# Patient Record
Sex: Male | Born: 1960
Health system: Southern US, Community
[De-identification: ages and names within clinical notes are randomized; demographics above are authoritative.]

## PROBLEM LIST (undated history)

## (undated) DIAGNOSIS — I10 Essential (primary) hypertension: Secondary | ICD-10-CM

---

## 2012-02-27 ENCOUNTER — Other Ambulatory Visit: Payer: Self-pay | Admitting: Family Medicine

## 2012-02-27 DIAGNOSIS — R51 Headache: Secondary | ICD-10-CM

## 2012-02-28 ENCOUNTER — Inpatient Hospital Stay
Admission: RE | Admit: 2012-02-28 | Discharge: 2012-02-28 | Payer: Self-pay | Source: Ambulatory Visit | Attending: Family Medicine | Admitting: Family Medicine

## 2012-03-04 ENCOUNTER — Ambulatory Visit
Admission: RE | Admit: 2012-03-04 | Discharge: 2012-03-04 | Disposition: A | Discharge status: Home / Self Care | Payer: BC Managed Care – PPO | Source: Ambulatory Visit | Attending: Family Medicine | Admitting: Family Medicine

## 2012-03-04 ENCOUNTER — Other Ambulatory Visit: Payer: Self-pay

## 2012-03-04 DIAGNOSIS — R51 Headache: Secondary | ICD-10-CM

## 2014-07-26 ENCOUNTER — Emergency Department (HOSPITAL_COMMUNITY)
Admission: EM | Admit: 2014-07-26 | Discharge: 2014-07-26 | Disposition: A | Discharge status: Home / Self Care | Payer: BC Managed Care – PPO | Source: Home / Self Care | Attending: Family Medicine | Admitting: Family Medicine

## 2014-07-26 ENCOUNTER — Encounter (HOSPITAL_COMMUNITY): Payer: Self-pay | Admitting: Emergency Medicine

## 2014-07-26 DIAGNOSIS — K649 Unspecified hemorrhoids: Secondary | ICD-10-CM

## 2014-07-26 LAB — OCCULT BLOOD, POC DEVICE: Fecal Occult Bld: NEGATIVE

## 2014-07-26 MED ORDER — DOCUSATE SODIUM 100 MG PO CAPS: 100.0000 mg | ORAL_CAPSULE | Freq: Two times a day (BID) | ORAL | Status: AC

## 2014-07-26 NOTE — ED Provider Notes (Signed)
CSN: [id number]     Arrival date & time 07/26/14  1229 History   First MD Initiated Contact with Patient 07/26/14 1358     Chief Complaint  Patient presents with  . Blood In Stools   (Consider location/radiation/quality/duration/timing/severity/associated sxs/prior Treatment) HPI Comments: Patient presents with concerns of occasionally seeing blood in water of toilet and on toilet tissue following a few recent hard stools. States he has a normal BM this morning with no associated bleeding.  PCP: none No aspirin, NSAIDs or prescription anticoagulants. Denies abdominal pain, dizziness, dyspnea.  Does not smoke Works at [employer].   The history is provided by the patient.    History reviewed. No pertinent past medical history. History reviewed. No pertinent past surgical history. History reviewed. No pertinent family history. History  Substance Use Topics  . Smoking status: Never Smoker   . Smokeless tobacco: Not on file  . Alcohol Use: Yes     Comment: occasional    Review of Systems  All other systems reviewed and are negative.   Allergies  Review of patient's allergies indicates not on file.  Home Medications   Prior to Admission medications   Medication Sig Start Date End Date Taking? Authorizing Provider  docusate sodium (COLACE) 100 MG capsule Take 1 capsule (100 mg total) by mouth every 12 (twelve) hours. 07/26/14   Annett Gula H Shauntelle Jamerson, PA   BP 125/77 mmHg  Pulse 65  Temp(Src) 98.2 F (36.8 C) (Oral)  Resp 16  SpO2 100% Physical Exam  Constitutional: He is oriented to person, place, and time. He appears well-developed and well-nourished. No distress.  HENT:  Head: Normocephalic and atraumatic.  Eyes: Conjunctivae are normal. No scleral icterus.  Cardiovascular: Normal rate, regular rhythm and normal heart sounds.   Pulmonary/Chest: Effort normal and breath sounds normal.  Abdominal: Soft. Bowel sounds are normal. He exhibits no distension.  There is no tenderness.  Genitourinary: Rectal exam shows external hemorrhoid. Rectal exam shows no internal hemorrhoid, no fissure, no mass, no tenderness and anal tone normal. Guaiac negative stool.  Stool normal color Small external, non-thrombosed, non-tender, non-bleeding hemorrhoid   Musculoskeletal: Normal range of motion.  Neurological: He is alert and oriented to person, place, and time.  Skin: Skin is warm and dry. No pallor.  Psychiatric: He has a normal mood and affect. His behavior is normal.  Nursing note and vitals reviewed.   ED Course  Procedures (including critical care time) Labs Review Labs Reviewed - No data to display  Imaging Review No results found.   MDM   1. Hemorrhoids, unspecified hemorrhoid type    Will advise increasing dietary fiber or using OTC stool softener and Tucks pads as needed.     Lutricia Feil, [place] 07/26/14 [phone]

## 2014-07-26 NOTE — ED Notes (Signed)
Pt states that there has been blood in stool since 07/24/2014

## 2018-10-10 ENCOUNTER — Emergency Department (HOSPITAL_COMMUNITY)
Admission: EM | Admit: 2018-10-10 | Discharge: 2018-10-10 | Disposition: A | Discharge status: Home / Self Care | Payer: BLUE CROSS/BLUE SHIELD | Attending: Emergency Medicine | Admitting: Emergency Medicine

## 2018-10-10 ENCOUNTER — Emergency Department (HOSPITAL_COMMUNITY): Payer: BLUE CROSS/BLUE SHIELD

## 2018-10-10 ENCOUNTER — Other Ambulatory Visit: Payer: Self-pay

## 2018-10-10 DIAGNOSIS — Z79899 Other long term (current) drug therapy: Secondary | ICD-10-CM | POA: Diagnosis not present

## 2018-10-10 DIAGNOSIS — R079 Chest pain, unspecified: Secondary | ICD-10-CM | POA: Insufficient documentation

## 2018-10-10 DIAGNOSIS — I10 Essential (primary) hypertension: Secondary | ICD-10-CM

## 2018-10-10 LAB — BASIC METABOLIC PANEL
Anion gap: 8 (ref 5–15)
BUN: 10 mg/dL (ref 6–20)
CO2: 28 mmol/L (ref 22–32)
Calcium: 9.5 mg/dL (ref 8.9–10.3)
Chloride: 105 mmol/L (ref 98–111)
Creatinine, Ser: 1.3 mg/dL — ABNORMAL HIGH (ref 0.61–1.24)
GFR, EST NON AFRICAN AMERICAN: 60 mL/min — AB (ref >60)
Glucose, Bld: 101 mg/dL — ABNORMAL HIGH (ref 70–99)
Potassium: 3.7 mmol/L (ref 3.5–5.1)
SODIUM: 141 mmol/L (ref 135–145)

## 2018-10-10 LAB — CBC
HCT: 46.3 % (ref 39.0–52.0)
Hemoglobin: 15 g/dL (ref 13.0–17.0)
MCH: 27.3 pg (ref 26.0–34.0)
MCHC: 32.4 g/dL (ref 30.0–36.0)
MCV: 84.2 fL (ref 80.0–100.0)
NRBC: 0 % (ref 0.0–0.2)
Platelets: 168 10*3/uL (ref 150–400)
RBC: 5.5 MIL/uL (ref 4.22–5.81)
RDW: 12.4 % (ref 11.5–15.5)
WBC: 5.2 10*3/uL (ref 4.0–10.5)

## 2018-10-10 LAB — I-STAT TROPONIN, ED: Troponin i, poc: 0 ng/mL (ref 0.00–0.08)

## 2018-10-10 MED ORDER — SODIUM CHLORIDE 0.9% FLUSH
3.0000 mL | Freq: Once | INTRAVENOUS | Status: AC
  Administered 2018-10-10: 3 mL via INTRAVENOUS

## 2018-10-10 MED ORDER — KETOROLAC TROMETHAMINE 30 MG/ML IJ SOLN
30.0000 mg | Freq: Once | INTRAMUSCULAR | Status: AC
  Administered 2018-10-10: 30 mg via INTRAVENOUS
  Filled 2018-10-10: qty 1

## 2018-10-10 NOTE — Discharge Instructions (Addendum)
You were seen in the emergency department for chest and back pain that is been going on for 2 months.  You had blood work, EKG, and a chest x-ray that did not show any serious findings.  I think this pain is most likely related to your chest and back muscles from your work duties.  You should use ibuprofen 200 mg 3 times a day with some food.  You should also do some gentle stretching.  Please follow-up with the medical clinic as your blood pressure was elevated and will need to be followed and potentially put on some medication for that.  Return if any concerns.

## 2018-10-10 NOTE — ED Provider Notes (Signed)
[facility] EMERGENCY DEPARTMENT Provider Note   CSN: [id number] Arrival date & time: 10/10/18  1502     History   Chief Complaint Chief Complaint  Patient presents with  . Chest Pain    HPI Howard Faulkner is a 58 y.o. male.  He is presenting to the emergency department with complaint of 2 months of chest pain.  It says that usually occurs Monday through Friday while he is working.  He says Friday is the worst.  It is across to his chest and through his mid back.  It hurts more with movement.  Improves with rest over the weekend.  He is tried nothing for it.  Not associate with any nausea or vomiting or shortness of breath.  He is never had any heart troubles before.  He is from [place] and is been here for 5 years and primarily speaks [place].  I asked him if he preferred to use the interpreter and he said no. The history is provided by the patient.  Chest Pain  Pain location:  Substernal area, L lateral chest and R lateral chest Pain quality: aching   Pain radiates to:  Mid back Pain severity:  Moderate Onset quality:  Gradual Duration:  2 months Timing:  Intermittent Progression:  Unchanged Chronicity:  New Context: lifting and movement   Context: not trauma   Relieved by:  Rest Worsened by:  Exertion and movement Ineffective treatments:  None tried Associated symptoms: back pain   Associated symptoms: no abdominal pain, no altered mental status, no fever, no heartburn, no numbness, no shortness of breath and no syncope   Risk factors: no hypertension, no smoking and no surgery     No past medical history on file.  There are no active problems to display for this patient.   No past surgical history on file.      Home Medications    Prior to Admission medications   Medication Sig Start Date End Date Taking? Authorizing Provider  docusate sodium (COLACE) 100 MG capsule Take 1 capsule (100 mg total) by mouth every 12 (twelve) hours. 07/26/14    Presson, Audelia Hives, PA    Family History No family history on file.  Social History Social History   Tobacco Use  . Smoking status: Never Smoker  Substance Use Topics  . Alcohol use: Yes    Comment: occasional  . Drug use: No     Allergies   Patient has no allergy information on record.   Review of Systems Review of Systems  Constitutional: Negative for fever.  HENT: Negative for sore throat.   Eyes: Negative for visual disturbance.  Respiratory: Negative for shortness of breath.   Cardiovascular: Positive for chest pain. Negative for syncope.  Gastrointestinal: Negative for abdominal pain and heartburn.  Genitourinary: Negative for dysuria.  Musculoskeletal: Positive for back pain and neck pain.  Skin: Negative for rash.  Neurological: Negative for numbness.     Physical Exam Updated Vital Signs BP (!) 150/106   Pulse 75   Resp 18   SpO2 98%   Physical Exam Vitals signs and nursing note reviewed.  Constitutional:      Appearance: He is well-developed.  HENT:     Head: Normocephalic and atraumatic.  Eyes:     Conjunctiva/sclera: Conjunctivae normal.  Neck:     Musculoskeletal: Neck supple.  Cardiovascular:     Rate and Rhythm: Normal rate and regular rhythm.     Heart sounds: No murmur.  Pulmonary:     Effort: Pulmonary effort is normal. No respiratory distress.     Breath sounds: Normal breath sounds.  Chest:     Chest wall: Tenderness (over sternum) present.  Abdominal:     Palpations: Abdomen is soft.     Tenderness: There is no abdominal tenderness.  Musculoskeletal:     Right lower leg: He exhibits no tenderness. No edema.     Left lower leg: He exhibits no tenderness. No edema.  Skin:    General: Skin is warm and dry.     Capillary Refill: Capillary refill takes less than 2 seconds.  Neurological:     General: No focal deficit present.     Mental Status: He is alert and oriented to person, place, and time.     Motor: No weakness.       ED Treatments / Results  Labs (all labs ordered are listed, but only abnormal results are displayed) Labs Reviewed  BASIC METABOLIC PANEL - Abnormal; Notable for the following components:      Result Value   Glucose, Bld 101 (*)    Creatinine, Ser 1.30 (*)    GFR calc non Af Amer 60 (*)    All other components within normal limits  CBC  I-STAT TROPONIN, ED    EKG EKG Interpretation  Date/Time:  Friday October 10 2018 15:05:01 EST Ventricular Rate:  84 PR Interval:    QRS Duration: 83 QT Interval:  351 QTC Calculation: 415 R Axis:   13 Text Interpretation:  Sinus rhythm Borderline T abnormalities, inferior leads no prior to compare with Confirmed by Aletta Edouard [id number]) on 10/10/2018 3:14:37 PM   Radiology Dg Chest 2 View  Result Date: 10/10/2018 CLINICAL DATA:  Left chest pain and shortness of breath for 2 months. EXAM: CHEST - 2 VIEW COMPARISON:  None. FINDINGS: The cardiac silhouette, mediastinal and hilar contours are within normal limits. The lungs are clear. No pleural effusion. No worrisome pulmonary lesions. The bony thorax is intact. IMPRESSION: No acute cardiopulmonary findings. Electronically Signed   By: Marijo Sanes M.D.   On: 10/10/2018 16:04    Procedures Procedures (including critical care time)  Medications Ordered in ED Medications  sodium chloride flush (NS) 0.9 % injection 3 mL (3 mLs Intravenous Given 10/10/18 1527)  ketorolac (TORADOL) 30 MG/ML injection 30 mg (30 mg Intravenous Given 10/10/18 1648)     Initial Impression / Assessment and Plan / ED Course  I have reviewed the triage vital signs and the nursing notes.  Pertinent labs & imaging results that were available during my care of the patient were reviewed by me and considered in my medical decision making (see chart for details).  Clinical Course as of Oct 10 2250  Fri Oct 10, 2018  1531 With no past medical history here with 2 months of intermittent chest pain.  He says he  usually gets the pain Monday through Friday and it is associated with his work.  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  He has some reproducible pain on palpation of his chest.  EKG is unremarkable the no priors to compare with.  He is getting some screening labs including chest x-ray troponin.   [MB]  2482 I reviewed the results of the patient testing with him via a [place] interpreter.  He understood and he actually has a primary care doctor to follow-up with.  I answered all his questions to the best my ability.   [  MB]    Clinical Course User Index [MB] Hayden Rasmussen, MD     Final Clinical Impressions(s) / ED Diagnoses   Final diagnoses:  Nonspecific chest pain  Hypertension, unspecified type    ED Discharge Orders    None       Hayden Rasmussen, MD 10/10/18 2252

## 2018-10-10 NOTE — ED Triage Notes (Signed)
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

## 2019-02-25 ENCOUNTER — Encounter: Payer: Self-pay | Admitting: Internal Medicine

## 2019-10-01 DIAGNOSIS — H401133 Primary open-angle glaucoma, bilateral, severe stage: Secondary | ICD-10-CM | POA: Diagnosis not present

## 2019-10-23 DIAGNOSIS — E782 Mixed hyperlipidemia: Secondary | ICD-10-CM | POA: Diagnosis not present

## 2019-10-23 DIAGNOSIS — R1084 Generalized abdominal pain: Secondary | ICD-10-CM | POA: Diagnosis not present

## 2019-10-23 DIAGNOSIS — N529 Male erectile dysfunction, unspecified: Secondary | ICD-10-CM | POA: Diagnosis not present

## 2019-10-23 DIAGNOSIS — I10 Essential (primary) hypertension: Secondary | ICD-10-CM | POA: Diagnosis not present

## 2019-10-23 DIAGNOSIS — R972 Elevated prostate specific antigen [PSA]: Secondary | ICD-10-CM | POA: Diagnosis not present

## 2019-10-23 DIAGNOSIS — E559 Vitamin D deficiency, unspecified: Secondary | ICD-10-CM | POA: Diagnosis not present

## 2019-10-23 DIAGNOSIS — N401 Enlarged prostate with lower urinary tract symptoms: Secondary | ICD-10-CM | POA: Diagnosis not present

## 2019-10-27 DIAGNOSIS — R972 Elevated prostate specific antigen [PSA]: Secondary | ICD-10-CM | POA: Diagnosis not present

## 2019-10-27 DIAGNOSIS — Z0001 Encounter for general adult medical examination with abnormal findings: Secondary | ICD-10-CM | POA: Diagnosis not present

## 2019-10-27 DIAGNOSIS — R739 Hyperglycemia, unspecified: Secondary | ICD-10-CM | POA: Diagnosis not present

## 2019-10-27 DIAGNOSIS — K858 Other acute pancreatitis without necrosis or infection: Secondary | ICD-10-CM | POA: Diagnosis not present

## 2019-10-27 DIAGNOSIS — Z01118 Encounter for examination of ears and hearing with other abnormal findings: Secondary | ICD-10-CM | POA: Diagnosis not present

## 2019-10-27 DIAGNOSIS — E782 Mixed hyperlipidemia: Secondary | ICD-10-CM | POA: Diagnosis not present

## 2019-10-27 DIAGNOSIS — Z136 Encounter for screening for cardiovascular disorders: Secondary | ICD-10-CM | POA: Diagnosis not present

## 2019-10-27 DIAGNOSIS — I10 Essential (primary) hypertension: Secondary | ICD-10-CM | POA: Diagnosis not present

## 2019-11-10 DIAGNOSIS — E782 Mixed hyperlipidemia: Secondary | ICD-10-CM | POA: Diagnosis not present

## 2019-11-10 DIAGNOSIS — K858 Other acute pancreatitis without necrosis or infection: Secondary | ICD-10-CM | POA: Diagnosis not present

## 2019-11-10 DIAGNOSIS — R739 Hyperglycemia, unspecified: Secondary | ICD-10-CM | POA: Diagnosis not present

## 2019-11-10 DIAGNOSIS — Z0001 Encounter for general adult medical examination with abnormal findings: Secondary | ICD-10-CM | POA: Diagnosis not present

## 2019-11-10 DIAGNOSIS — F524 Premature ejaculation: Secondary | ICD-10-CM | POA: Diagnosis not present

## 2019-11-10 DIAGNOSIS — I10 Essential (primary) hypertension: Secondary | ICD-10-CM | POA: Diagnosis not present

## 2019-11-10 DIAGNOSIS — R972 Elevated prostate specific antigen [PSA]: Secondary | ICD-10-CM | POA: Diagnosis not present

## 2019-12-03 DIAGNOSIS — C61 Malignant neoplasm of prostate: Secondary | ICD-10-CM | POA: Diagnosis not present

## 2019-12-03 DIAGNOSIS — R3915 Urgency of urination: Secondary | ICD-10-CM | POA: Diagnosis not present

## 2020-02-08 DIAGNOSIS — C61 Malignant neoplasm of prostate: Secondary | ICD-10-CM | POA: Diagnosis not present

## 2020-02-15 DIAGNOSIS — R3915 Urgency of urination: Secondary | ICD-10-CM | POA: Diagnosis not present

## 2020-02-15 DIAGNOSIS — C61 Malignant neoplasm of prostate: Secondary | ICD-10-CM | POA: Diagnosis not present

## 2020-02-18 DIAGNOSIS — Z03818 Encounter for observation for suspected exposure to other biological agents ruled out: Secondary | ICD-10-CM | POA: Diagnosis not present

## 2020-02-18 DIAGNOSIS — Z20822 Contact with and (suspected) exposure to covid-19: Secondary | ICD-10-CM | POA: Diagnosis not present

## 2020-02-18 DIAGNOSIS — Z0131 Encounter for examination of blood pressure with abnormal findings: Secondary | ICD-10-CM | POA: Diagnosis not present

## 2020-06-10 DIAGNOSIS — H401133 Primary open-angle glaucoma, bilateral, severe stage: Secondary | ICD-10-CM | POA: Diagnosis not present

## 2020-06-24 ENCOUNTER — Other Ambulatory Visit: Payer: Self-pay | Admitting: Urology

## 2020-06-24 DIAGNOSIS — C61 Malignant neoplasm of prostate: Secondary | ICD-10-CM

## 2020-08-05 DIAGNOSIS — H401133 Primary open-angle glaucoma, bilateral, severe stage: Secondary | ICD-10-CM | POA: Diagnosis not present

## 2020-08-08 DIAGNOSIS — C61 Malignant neoplasm of prostate: Secondary | ICD-10-CM | POA: Diagnosis not present

## 2020-08-15 DIAGNOSIS — C61 Malignant neoplasm of prostate: Secondary | ICD-10-CM | POA: Diagnosis not present

## 2020-08-15 DIAGNOSIS — R3915 Urgency of urination: Secondary | ICD-10-CM | POA: Diagnosis not present

## 2020-09-09 DIAGNOSIS — M79672 Pain in left foot: Secondary | ICD-10-CM | POA: Diagnosis not present

## 2020-09-09 DIAGNOSIS — M19072 Primary osteoarthritis, left ankle and foot: Secondary | ICD-10-CM | POA: Diagnosis not present

## 2020-09-09 DIAGNOSIS — M19071 Primary osteoarthritis, right ankle and foot: Secondary | ICD-10-CM | POA: Diagnosis not present

## 2020-09-27 DIAGNOSIS — E782 Mixed hyperlipidemia: Secondary | ICD-10-CM | POA: Diagnosis not present

## 2020-09-27 DIAGNOSIS — R972 Elevated prostate specific antigen [PSA]: Secondary | ICD-10-CM | POA: Diagnosis not present

## 2020-09-27 DIAGNOSIS — Z0001 Encounter for general adult medical examination with abnormal findings: Secondary | ICD-10-CM | POA: Diagnosis not present

## 2020-09-27 DIAGNOSIS — I10 Essential (primary) hypertension: Secondary | ICD-10-CM | POA: Diagnosis not present

## 2020-09-27 DIAGNOSIS — F524 Premature ejaculation: Secondary | ICD-10-CM | POA: Diagnosis not present

## 2020-09-27 DIAGNOSIS — E559 Vitamin D deficiency, unspecified: Secondary | ICD-10-CM | POA: Diagnosis not present

## 2020-10-07 DIAGNOSIS — M79671 Pain in right foot: Secondary | ICD-10-CM | POA: Diagnosis not present

## 2020-10-07 DIAGNOSIS — M19071 Primary osteoarthritis, right ankle and foot: Secondary | ICD-10-CM | POA: Diagnosis not present

## 2020-10-19 ENCOUNTER — Other Ambulatory Visit: Payer: Self-pay | Admitting: Orthopaedic Surgery

## 2020-10-19 DIAGNOSIS — M25571 Pain in right ankle and joints of right foot: Secondary | ICD-10-CM

## 2020-10-20 ENCOUNTER — Other Ambulatory Visit: Payer: Self-pay | Admitting: Orthopaedic Surgery

## 2020-10-20 DIAGNOSIS — M25572 Pain in left ankle and joints of left foot: Secondary | ICD-10-CM

## 2020-11-11 ENCOUNTER — Ambulatory Visit
Admission: RE | Admit: 2020-11-11 | Discharge: 2020-11-11 | Disposition: A | Discharge status: Home / Self Care | Payer: BC Managed Care – PPO | Source: Ambulatory Visit | Attending: Orthopaedic Surgery | Admitting: Orthopaedic Surgery

## 2020-11-11 ENCOUNTER — Other Ambulatory Visit: Payer: BLUE CROSS/BLUE SHIELD

## 2020-11-11 ENCOUNTER — Other Ambulatory Visit: Payer: Self-pay

## 2020-11-11 DIAGNOSIS — R600 Localized edema: Secondary | ICD-10-CM | POA: Diagnosis not present

## 2020-11-11 DIAGNOSIS — M25572 Pain in left ankle and joints of left foot: Secondary | ICD-10-CM | POA: Diagnosis not present

## 2020-11-15 DIAGNOSIS — I10 Essential (primary) hypertension: Secondary | ICD-10-CM | POA: Diagnosis not present

## 2020-11-15 DIAGNOSIS — E559 Vitamin D deficiency, unspecified: Secondary | ICD-10-CM | POA: Diagnosis not present

## 2020-11-15 DIAGNOSIS — E782 Mixed hyperlipidemia: Secondary | ICD-10-CM | POA: Diagnosis not present

## 2020-11-15 DIAGNOSIS — N401 Enlarged prostate with lower urinary tract symptoms: Secondary | ICD-10-CM | POA: Diagnosis not present

## 2020-11-18 DIAGNOSIS — M19071 Primary osteoarthritis, right ankle and foot: Secondary | ICD-10-CM | POA: Diagnosis not present

## 2021-01-03 DIAGNOSIS — R1084 Generalized abdominal pain: Secondary | ICD-10-CM | POA: Diagnosis not present

## 2021-01-03 DIAGNOSIS — K5901 Slow transit constipation: Secondary | ICD-10-CM | POA: Diagnosis not present

## 2021-02-01 ENCOUNTER — Other Ambulatory Visit: Payer: Self-pay | Admitting: Urology

## 2021-02-01 DIAGNOSIS — C61 Malignant neoplasm of prostate: Secondary | ICD-10-CM

## 2021-02-13 DIAGNOSIS — C61 Malignant neoplasm of prostate: Secondary | ICD-10-CM | POA: Diagnosis not present

## 2021-02-21 DIAGNOSIS — C61 Malignant neoplasm of prostate: Secondary | ICD-10-CM | POA: Diagnosis not present

## 2021-02-21 DIAGNOSIS — R3915 Urgency of urination: Secondary | ICD-10-CM | POA: Diagnosis not present

## 2021-03-09 DIAGNOSIS — I10 Essential (primary) hypertension: Secondary | ICD-10-CM | POA: Diagnosis not present

## 2021-03-09 DIAGNOSIS — G4489 Other headache syndrome: Secondary | ICD-10-CM | POA: Diagnosis not present

## 2021-03-09 DIAGNOSIS — E782 Mixed hyperlipidemia: Secondary | ICD-10-CM | POA: Diagnosis not present

## 2021-03-09 DIAGNOSIS — E559 Vitamin D deficiency, unspecified: Secondary | ICD-10-CM | POA: Diagnosis not present

## 2021-03-17 ENCOUNTER — Emergency Department (HOSPITAL_COMMUNITY): Payer: BC Managed Care – PPO

## 2021-03-17 ENCOUNTER — Emergency Department (HOSPITAL_COMMUNITY)
Admission: EM | Admit: 2021-03-17 | Discharge: 2021-03-18 | Disposition: A | Discharge status: Home / Self Care | Payer: BC Managed Care – PPO | Attending: Emergency Medicine | Admitting: Emergency Medicine

## 2021-03-17 ENCOUNTER — Other Ambulatory Visit: Payer: Self-pay

## 2021-03-17 ENCOUNTER — Encounter (HOSPITAL_COMMUNITY): Payer: Self-pay

## 2021-03-17 ENCOUNTER — Ambulatory Visit
Admission: EM | Admit: 2021-03-17 | Discharge: 2021-03-17 | Disposition: A | Discharge status: Home / Self Care | Payer: BC Managed Care – PPO

## 2021-03-17 DIAGNOSIS — R079 Chest pain, unspecified: Secondary | ICD-10-CM | POA: Insufficient documentation

## 2021-03-17 DIAGNOSIS — G43909 Migraine, unspecified, not intractable, without status migrainosus: Secondary | ICD-10-CM | POA: Insufficient documentation

## 2021-03-17 DIAGNOSIS — R519 Headache, unspecified: Secondary | ICD-10-CM

## 2021-03-17 DIAGNOSIS — Z79899 Other long term (current) drug therapy: Secondary | ICD-10-CM | POA: Insufficient documentation

## 2021-03-17 DIAGNOSIS — Z20822 Contact with and (suspected) exposure to covid-19: Secondary | ICD-10-CM | POA: Diagnosis not present

## 2021-03-17 DIAGNOSIS — R0789 Other chest pain: Secondary | ICD-10-CM | POA: Diagnosis not present

## 2021-03-17 DIAGNOSIS — I10 Essential (primary) hypertension: Secondary | ICD-10-CM | POA: Insufficient documentation

## 2021-03-17 DIAGNOSIS — M25512 Pain in left shoulder: Secondary | ICD-10-CM

## 2021-03-17 HISTORY — DX: Essential (primary) hypertension: I10

## 2021-03-17 LAB — BASIC METABOLIC PANEL
Anion gap: 6 (ref 5–15)
BUN: 9 mg/dL (ref 8–23)
CO2: 27 mmol/L (ref 22–32)
Calcium: 9.4 mg/dL (ref 8.9–10.3)
Chloride: 104 mmol/L (ref 98–111)
Creatinine, Ser: 1.14 mg/dL (ref 0.61–1.24)
GFR, Estimated: >60 mL/min (ref >60)
Glucose, Bld: 99 mg/dL (ref 70–99)
Potassium: 4.2 mmol/L (ref 3.5–5.1)
Sodium: 137 mmol/L (ref 135–145)

## 2021-03-17 LAB — CBC WITH DIFFERENTIAL/PLATELET
Abs Immature Granulocytes: 0.03 10*3/uL (ref 0.00–0.07)
Basophils Absolute: 0.1 10*3/uL (ref 0.0–0.1)
Basophils Relative: 1 %
Eosinophils Absolute: 0.1 10*3/uL (ref 0.0–0.5)
Eosinophils Relative: 2 %
HCT: 45 % (ref 39.0–52.0)
Hemoglobin: 14.8 g/dL (ref 13.0–17.0)
Immature Granulocytes: 1 %
Lymphocytes Relative: 39 %
Lymphs Abs: 2.5 10*3/uL (ref 0.7–4.0)
MCH: 28 pg (ref 26.0–34.0)
MCHC: 32.9 g/dL (ref 30.0–36.0)
MCV: 85.2 fL (ref 80.0–100.0)
Monocytes Absolute: 0.4 10*3/uL (ref 0.1–1.0)
Monocytes Relative: 7 %
Neutro Abs: 3.2 10*3/uL (ref 1.7–7.7)
Neutrophils Relative %: 50 %
Platelets: 207 10*3/uL (ref 150–400)
RBC: 5.28 MIL/uL (ref 4.22–5.81)
RDW: 12.3 % (ref 11.5–15.5)
WBC: 6.3 10*3/uL (ref 4.0–10.5)
nRBC: 0 % (ref 0.0–0.2)

## 2021-03-17 LAB — TROPONIN I (HIGH SENSITIVITY): Troponin I (High Sensitivity): <2 ng/L (ref <18)

## 2021-03-17 MED ORDER — IOHEXOL 350 MG/ML SOLN
80.0000 mL | Freq: Once | INTRAVENOUS | Status: AC | PRN
  Administered 2021-03-17: 80 mL via INTRAVENOUS

## 2021-03-17 MED ORDER — KETOROLAC TROMETHAMINE 30 MG/ML IJ SOLN
15.0000 mg | Freq: Once | INTRAMUSCULAR | Status: AC
  Administered 2021-03-17: 15 mg via INTRAVENOUS
  Filled 2021-03-17: qty 1

## 2021-03-17 MED ORDER — DIPHENHYDRAMINE HCL 50 MG/ML IJ SOLN
25.0000 mg | Freq: Once | INTRAMUSCULAR | Status: AC
  Administered 2021-03-17: 25 mg via INTRAVENOUS
  Filled 2021-03-17: qty 1

## 2021-03-17 MED ORDER — METOCLOPRAMIDE HCL 5 MG/ML IJ SOLN
10.0000 mg | Freq: Once | INTRAMUSCULAR | Status: AC
  Administered 2021-03-17: 10 mg via INTRAVENOUS
  Filled 2021-03-17: qty 2

## 2021-03-17 NOTE — ED Provider Notes (Signed)
[facility] DEPT Provider Note   CSN: [id number] Arrival date & time: 03/17/21  1920     History Chief Complaint  Patient presents with   Migraine   Blurred Vision    Howard Faulkner is a 60 y.o. male.  Patient presents to the emergency department with a chief complaint of persistent left-sided headaches for the past couple of weeks.  His daughter acts as Optometrist, Corporate investment banker was malfunctioning.  He states that the headache is located to the left side of his head.  He reports associated blurry vision, he does have history of glaucoma, but reports that his vision has worsened in the past few weeks.  He has seen his doctor for the headaches and was prescribed a pain reliever.  He denies numbness, weakness, or tingling of his extremities.  Denies fevers or chills.  He reports some muscle soreness in his upper neck and back.  The history is provided by the patient. A language interpreter was used (Daughter).      Past Medical History:  Diagnosis Date   Hypertension     There are no problems to display for this patient.   History reviewed. No pertinent surgical history.     History reviewed. No pertinent family history.  Social History   Tobacco Use   Smoking status: Never   Smokeless tobacco: Never  Substance Use Topics   Alcohol use: Yes    Comment: occasional   Drug use: No    Home Medications Prior to Admission medications   Medication Sig Start Date End Date Taking? Authorizing Provider  amLODipine-olmesartan (AZOR) 5-20 MG tablet Take 1 tablet by mouth daily. 11/15/20   [provider]  docusate sodium (COLACE) 100 MG capsule Take 1 capsule (100 mg total) by mouth every 12 (twelve) hours. 07/26/14   Presson, Audelia Hives, PA  dorzolamide-timolol (COSOPT) 22.3-6.8 MG/ML ophthalmic solution 1 drop 2 (two) times daily. 12/30/20   [provider]  etodolac (LODINE) 500 MG tablet Take 500 mg by mouth 2 (two)  times daily. 03/14/21   [provider]  gabapentin (NEURONTIN) 300 MG capsule Take 300 mg by mouth 2 (two) times daily. 03/09/21   [provider]  latanoprost (XALATAN) 0.005 % ophthalmic solution SMARTSIG:1 Drop(s) In Eye(s) Every Evening 12/30/20   [provider]  tamsulosin (FLOMAX) 0.4 MG CAPS capsule Take 0.4 mg by mouth daily. 11/15/20   [provider]    Allergies    Patient has no known allergies.  Review of Systems   Review of Systems  All other systems reviewed and are negative.  Physical Exam Updated Vital Signs BP (!) 151/92   Pulse 71   Temp 99.9 F (37.7 C) (Oral)   Resp (!) 21   SpO2 100%   Physical Exam Vitals and nursing note reviewed.  Constitutional:      Appearance: He is well-developed.  HENT:     Head: Normocephalic and atraumatic.  Eyes:     Conjunctiva/sclera: Conjunctivae normal.  Neck:     Comments: Cervical paraspinal muscle tenderness Cardiovascular:     Rate and Rhythm: Normal rate and regular rhythm.     Heart sounds: No murmur heard. Pulmonary:     Effort: Pulmonary effort is normal. No respiratory distress.     Breath sounds: Normal breath sounds.  Abdominal:     Palpations: Abdomen is soft.     Tenderness: There is no abdominal tenderness.  Musculoskeletal:     Cervical back: Neck  supple.  Lymphadenopathy:     Cervical: No cervical adenopathy.  Skin:    General: Skin is warm and dry.  Neurological:     Mental Status: He is alert.     Comments: CN 3-12 intact, speech is clear, movements are goal oriented Normal sensation and strength in extremities  Psychiatric:        Mood and Affect: Mood normal.        Behavior: Behavior normal.    ED Results / Procedures / Treatments   Labs (all labs ordered are listed, but only abnormal results are displayed) Labs Reviewed  RESP PANEL BY RT-PCR (FLU A&B, COVID) ARPGX2  CBC WITH DIFFERENTIAL/PLATELET  BASIC METABOLIC PANEL  TROPONIN I (HIGH  SENSITIVITY)  TROPONIN I (HIGH SENSITIVITY)    EKG None  Radiology DG Chest 2 View  Result Date: 03/17/2021 CLINICAL DATA:  Left-sided headache radiating to left side of neck, 1 month of thoracic pain worse with bending EXAM: CHEST - 2 VIEW COMPARISON:  10/10/2018 FINDINGS: Frontal and lateral views of the chest demonstrate an unremarkable cardiac silhouette. No airspace disease, effusion, or pneumothorax. No acute bony abnormalities. IMPRESSION: 1. No acute intrathoracic process. Electronically Signed   By: Randa Ngo M.D.   On: 03/17/2021 21:20   CT Head Wo Contrast  Result Date: 03/17/2021 CLINICAL DATA:  Acute neurologic defect, left-sided headache, blurred vision EXAM: CT HEAD WITHOUT CONTRAST TECHNIQUE: Contiguous axial images were obtained from the base of the skull through the vertex without intravenous contrast. COMPARISON:  03/04/2012 FINDINGS: Brain: Normal anatomic configuration. No abnormal intra or extra-axial mass lesion or fluid collection. No abnormal mass effect or midline shift. No evidence of acute intracranial hemorrhage or infarct. Ventricular size is normal. Cerebellum unremarkable. Vascular: Unremarkable Skull: Intact Sinuses/Orbits: Paranasal sinuses are clear. Orbits are unremarkable. Other: Mastoid air cells and middle ear cavities are clear. IMPRESSION: No acute intracranial abnormality.  Normal exam. Electronically Signed   By: Fidela Salisbury MD   On: 03/17/2021 20:34    Procedures Procedures   Medications Ordered in ED Medications  ketorolac (TORADOL) 30 MG/ML injection 15 mg (has no administration in time range)  metoCLOPramide (REGLAN) injection 10 mg (has no administration in time range)  diphenhydrAMINE (BENADRYL) injection 25 mg (has no administration in time range)    ED Course  I have reviewed the triage vital signs and the nursing notes.  Pertinent labs & imaging results that were available during my care of the patient were reviewed by me and  considered in my medical decision making (see chart for details).    MDM Rules/Calculators/A&P                          This patient complains of headache x several weeks, this involves an extensive number of treatment options, and is a complaint that carries with it a high risk of complications and morbidity.    Labs ordered in triage are negative for any significant abnormality.  Imaging Interpretation CT head ordered in triage is negative, will add CT angio head and neck.  If this is negative, feel the patient can be safely discharged with outpatient follow-up.  Medications I ordered medication toradol, reglan, benadryl for headache  Sources Additional history obtained from daughter, who corroborates history.  Reassessments After the interventions stated above, I reevaluated the patient and found feeling improved, states headache symptoms have resolved.  Patient is alert and oriented.  Given that patient has been having the  symptoms for the past several weeks, reassuring results tonight, I do not feel that further emergent work-up is indicated.  I have a low suspicion for meningitis or encephalitis given the length of illness.  I am suspicious for COVID, COVID test is pending.  I have discussed the treatment plan with the patient, he is agreeable with following up on an outpatient basis.  Return precautions discussed.  Consultants None  Plan Discharge, follow-up with PCP, neuro, and ophthalmology.   Final Clinical Impression(s) / ED Diagnoses Final diagnoses:  Chest pain    Rx / DC Orders ED Discharge Orders     None        Montine Circle, PA-C 03/18/21 0107    Sherwood Gambler, MD 03/21/21 [phone]

## 2021-03-17 NOTE — ED Triage Notes (Signed)
Pt reports headache that radiates to left eye and left side of neck that began 2-3 weeks ago. Pt has hx of glaucoma is completely blind in right eye.

## 2021-03-17 NOTE — ED Provider Notes (Signed)
[facility]   MRN: [id number] DOB: 07/11/1961  Subjective:   Howard Faulkner is a 60 y.o. male presenting for 1 month history of persistent and worsening left-sided headache, head pain, now having left shoulder pain, worsening eye pain and blurred vision.  He has a longstanding history of glaucoma and has had blurred vision in the past but feels like it is worse now.  He is also had 1 month history of chest pain across his entire chest that radiates to his back.  He discussed this with his primary care provider but his daughter states that no tests were really done.  He is not a smoker.  No history of MI, heart disease.  He does have a history of hypertension and is on medication for this.  Denies diaphoresis, nausea, vomiting, weakness on one side of the body, numbness and/or tingling.  No current facility-administered medications for this encounter.  Current Outpatient Medications:    amLODipine-olmesartan (AZOR) 5-20 MG tablet, Take 1 tablet by mouth daily., Disp: , Rfl:    docusate sodium (COLACE) 100 MG capsule, Take 1 capsule (100 mg total) by mouth every 12 (twelve) hours., Disp: 60 capsule, Rfl: 0   dorzolamide-timolol (COSOPT) 22.3-6.8 MG/ML ophthalmic solution, 1 drop 2 (two) times daily., Disp: , Rfl:    etodolac (LODINE) 500 MG tablet, Take 500 mg by mouth 2 (two) times daily., Disp: , Rfl:    gabapentin (NEURONTIN) 300 MG capsule, Take 300 mg by mouth 2 (two) times daily., Disp: , Rfl:    latanoprost (XALATAN) 0.005 % ophthalmic solution, SMARTSIG:1 Drop(s) In Eye(s) Every Evening, Disp: , Rfl:    tamsulosin (FLOMAX) 0.4 MG CAPS capsule, Take 0.4 mg by mouth daily., Disp: , Rfl:    No Known Allergies  Past Medical History:  Diagnosis Date   Hypertension      History reviewed. No pertinent surgical history.  History reviewed. No pertinent family history.  Social History   Tobacco Use   Smoking status: Never   Smokeless tobacco: Never  Substance Use Topics    Alcohol use: Yes    Comment: occasional   Drug use: No    ROS   Objective:   Vitals: BP (!) 150/95 (BP Location: Left Arm)   Pulse 83   Temp 99.1 F (37.3 C) (Oral)   Resp 18   SpO2 96%   Physical Exam Constitutional:      General: He is not in acute distress.    Appearance: Normal appearance. He is well-developed. He is not ill-appearing, toxic-appearing or diaphoretic.  HENT:     Head: Normocephalic and atraumatic.     Right Ear: External ear normal.     Left Ear: External ear normal.     Nose: Nose normal.     Mouth/Throat:     Mouth: Mucous membranes are moist.     Pharynx: Oropharynx is clear.  Eyes:     General: Visual field deficit (not new) present. No scleral icterus.    Pupils: Pupils are unequal.  Cardiovascular:     Rate and Rhythm: Normal rate and regular rhythm.     Heart sounds: Normal heart sounds. No murmur heard.   No friction rub. No gallop.  Pulmonary:     Effort: Pulmonary effort is normal. No respiratory distress.     Breath sounds: Normal breath sounds. No stridor. No wheezing, rhonchi or rales.  Neurological:     Mental Status: He is alert and oriented to person, place, and time.  Cranial Nerves: No dysarthria or facial asymmetry.     Motor: No weakness.     Coordination: Romberg sign negative. Coordination normal.     Gait: Gait normal.     Comments: Negative pronator drift.  Psychiatric:        Mood and Affect: Mood normal.        Behavior: Behavior normal.        Thought Content: Thought content normal.    ED ECG REPORT   Date: 03/17/2021  EKG Time: 6:25 PM  Rate: 76bpm  Rhythm: normal sinus rhythm,  normal EKG, normal sinus rhythm  Axis: Normal  Intervals:none  ST&T Change: None  Narrative Interpretation: Sinus rhythm at 76 bpm without acute findings.  Comparable to previous EKG.   Assessment and Plan :   PDMP not reviewed this encounter.  1. Severe headache   2. Essential hypertension   3. Atypical chest pain    4. Acute pain of left shoulder     Patient has concerning symptoms warranting work-up for acute encephalopathy, acute cardiopulmonary problems such as ACS.  Recommended further evaluation in the emergency room.  Patient has clear cardiopulmonary exam and nonacute EKG and therefore we agreed to avoid transport by EMS.  Patient's daughter contracts for safety, will take him to the emergency room now.   Jaynee Eagles, PA-C 03/17/21 1840

## 2021-03-17 NOTE — Discharge Instructions (Addendum)
Please report to the emergency room now as I am concerned that you have a severe left sided headache with elevated blood pressure. We are not sending him by ambulance as his ekg does not show an active heart attack.

## 2021-03-17 NOTE — ED Provider Notes (Signed)
Emergency Medicine Provider Triage Evaluation Note  Howard Faulkner , a 60 y.o. male  was evaluated in triage.  Pt complains of persistent left-sided headache associated with blurry vision.  Patient has a history of glaucoma however, notes his vision has worsened.  Patient also admits to chest pain for the past month that radiates to his back. Chest pain wraps around his entire lower chest wall. No tobacco use.  No history of MI.  Denies diaphoresis, nausea, vomiting, unilateral weakness, numbness/tingling.   Review of Systems  Positive: CP, headache Negative: fever  Physical Exam  BP (!) 146/91   Pulse 83   Temp 99.9 F (37.7 C) (Oral)   Resp 16   SpO2 99%  Gen:   Awake, no distress   Resp:  Normal effort  MSK:   Moves extremities without difficulty  Other:    Medical Decision Making  Medically screening exam initiated at 7:56 PM.  Appropriate orders placed.  Howard Faulkner was informed that the remainder of the evaluation will be completed by another provider, this initial triage assessment does not replace that evaluation, and the importance of remaining in the ED until their evaluation is complete.  CT head Cardiac labs   Karie Kirks 03/17/21 Marney Setting, MD 03/21/21 [phone]

## 2021-03-17 NOTE — ED Triage Notes (Signed)
Three weeks h/o left sided HA that radiates to his left eye, ear and left side of his neck. Pt has a h/o glaucoma. Notes HA is making his vision worse. Denies photophobia.   Pt also complains of greater than 87mo h/o thoracic back pain that radiates underneath his bilateral breast bone. Pain is worse with bending and twisting.

## 2021-03-18 LAB — RESP PANEL BY RT-PCR (FLU A&B, COVID) ARPGX2
Influenza A by PCR: NEGATIVE
Influenza B by PCR: NEGATIVE
SARS Coronavirus 2 by RT PCR: NEGATIVE

## 2021-03-18 LAB — TROPONIN I (HIGH SENSITIVITY): Troponin I (High Sensitivity): <2 ng/L (ref <18)

## 2021-03-18 NOTE — ED Notes (Signed)
Discharged no concerns 

## 2021-03-18 NOTE — Discharge Instructions (Addendum)
No clear cause was identified for your symptoms tonight.  Your COVID test is pending.  I will call you if these results are positive.  Your CT scans did not reveal any evidence of stroke or injury to any blood vessels.  I recommend that you follow-up with a headache specialist along with your eye doctor and your primary care doctor.  Please return to the emergency department for new or worsening symptoms.

## 2021-03-31 DIAGNOSIS — H401133 Primary open-angle glaucoma, bilateral, severe stage: Secondary | ICD-10-CM | POA: Diagnosis not present

## 2021-04-12 ENCOUNTER — Ambulatory Visit: Payer: BC Managed Care – PPO | Admitting: Nurse Practitioner

## 2021-04-17 ENCOUNTER — Ambulatory Visit: Payer: BC Managed Care – PPO | Admitting: Nurse Practitioner

## 2021-05-05 DIAGNOSIS — G4451 Hemicrania continua: Secondary | ICD-10-CM | POA: Diagnosis not present

## 2021-05-05 DIAGNOSIS — G4452 New daily persistent headache (NDPH): Secondary | ICD-10-CM | POA: Diagnosis not present

## 2021-05-05 DIAGNOSIS — Z049 Encounter for examination and observation for unspecified reason: Secondary | ICD-10-CM | POA: Diagnosis not present

## 2021-05-17 DIAGNOSIS — G4452 New daily persistent headache (NDPH): Secondary | ICD-10-CM | POA: Diagnosis not present

## 2021-05-17 DIAGNOSIS — G4451 Hemicrania continua: Secondary | ICD-10-CM | POA: Diagnosis not present

## 2021-05-17 DIAGNOSIS — Z79899 Other long term (current) drug therapy: Secondary | ICD-10-CM | POA: Diagnosis not present

## 2021-05-17 DIAGNOSIS — M542 Cervicalgia: Secondary | ICD-10-CM | POA: Diagnosis not present

## 2021-05-31 DIAGNOSIS — G4452 New daily persistent headache (NDPH): Secondary | ICD-10-CM | POA: Diagnosis not present

## 2021-05-31 DIAGNOSIS — G4451 Hemicrania continua: Secondary | ICD-10-CM | POA: Diagnosis not present

## 2021-06-14 DIAGNOSIS — G4452 New daily persistent headache (NDPH): Secondary | ICD-10-CM | POA: Diagnosis not present

## 2021-06-14 DIAGNOSIS — G4451 Hemicrania continua: Secondary | ICD-10-CM | POA: Diagnosis not present

## 2021-06-14 DIAGNOSIS — M542 Cervicalgia: Secondary | ICD-10-CM | POA: Diagnosis not present

## 2021-06-20 ENCOUNTER — Ambulatory Visit: Payer: BC Managed Care – PPO | Admitting: Nurse Practitioner

## 2021-06-26 DIAGNOSIS — M542 Cervicalgia: Secondary | ICD-10-CM | POA: Diagnosis not present

## 2021-06-26 DIAGNOSIS — G4452 New daily persistent headache (NDPH): Secondary | ICD-10-CM | POA: Diagnosis not present

## 2021-06-26 DIAGNOSIS — G4451 Hemicrania continua: Secondary | ICD-10-CM | POA: Diagnosis not present

## 2021-07-20 ENCOUNTER — Encounter: Payer: Self-pay | Admitting: Nurse Practitioner

## 2021-07-20 ENCOUNTER — Other Ambulatory Visit: Payer: Self-pay

## 2021-07-20 ENCOUNTER — Ambulatory Visit: Payer: BC Managed Care – PPO | Admitting: Nurse Practitioner

## 2021-07-20 VITALS — BP 138/82 | HR 76 | Temp 98.1°F | Ht 67.0 in | Wt 184.8 lb

## 2021-07-20 DIAGNOSIS — R1013 Epigastric pain: Secondary | ICD-10-CM | POA: Diagnosis not present

## 2021-07-20 DIAGNOSIS — Z6828 Body mass index (BMI) 28.0-28.9, adult: Secondary | ICD-10-CM

## 2021-07-20 DIAGNOSIS — Z1321 Encounter for screening for nutritional disorder: Secondary | ICD-10-CM | POA: Diagnosis not present

## 2021-07-20 DIAGNOSIS — R7301 Impaired fasting glucose: Secondary | ICD-10-CM | POA: Diagnosis not present

## 2021-07-20 DIAGNOSIS — Z13228 Encounter for screening for other metabolic disorders: Secondary | ICD-10-CM | POA: Diagnosis not present

## 2021-07-20 DIAGNOSIS — Z0000 Encounter for general adult medical examination without abnormal findings: Secondary | ICD-10-CM

## 2021-07-20 DIAGNOSIS — Z1329 Encounter for screening for other suspected endocrine disorder: Secondary | ICD-10-CM | POA: Diagnosis not present

## 2021-07-20 DIAGNOSIS — Z7689 Persons encountering health services in other specified circumstances: Secondary | ICD-10-CM

## 2021-07-20 DIAGNOSIS — Z130 Encounter for screening for diseases of the blood and blood-forming organs and certain disorders involving the immune mechanism: Secondary | ICD-10-CM | POA: Diagnosis not present

## 2021-07-20 NOTE — Progress Notes (Signed)
New Patient Office Visit  Subjective:  Patient ID: Howard Faulkner, male    DOB: 1961/01/17  Age: 60 y.o. MRN: [id number]  CC:  Chief Complaint  Patient presents with   New Patient (Initial Visit)    HPI Howard Faulkner presents to establish new primary care provider. An interpreter and the patient's daughter are in the room to assist with language barrier.  He is transitioning from another provider in the area. Unhappy with the care he was receiving. He is due to have routine, fasting blood work. He is fasting today. He does have history of BPH. He does see urology on routine basis. His PSA is monitored per urology.  The patient is having sharp pain in right flank and radiates around to the right upper back. Pain is more severe first thing in in the morning when he gets up. Bending backwards causes this to hurt more. Riding in a care on a bumpy road also increases the pain. The pain is described as sharp. This started about four months ago. Started after exertional activity that was unusual and has not stopped.   Past Medical History:  Diagnosis Date   Hypertension     History reviewed. No pertinent surgical history.  Family History  Problem Relation Age of Onset   High blood pressure Sister     Social History   Socioeconomic History   Marital status: Married    Spouse name: Not on file   Number of children: Not on file   Years of education: Not on file   Highest education level: Not on file  Occupational History   Not on file  Tobacco Use   Smoking status: Never   Smokeless tobacco: Never  Substance and Sexual Activity   Alcohol use: Yes    Comment: occasional   Drug use: No   Sexual activity: Yes    Partners: Female  Other Topics Concern   Not on file  Social History Narrative   Not on file   Social Determinants of Health   Financial Resource Strain: Not on file  Food Insecurity: Not on file  Transportation Needs: Not on file  Physical Activity: Not on file   Stress: Not on file  Social Connections: Not on file  Intimate Partner Violence: Not on file    ROS Review of Systems  Constitutional:  Negative for activity change, chills, fatigue and fever.  HENT:  Negative for congestion, postnasal drip, rhinorrhea, sinus pressure, sinus pain, sneezing and sore throat.   Eyes: Negative.   Respiratory:  Negative for cough, shortness of breath and wheezing.   Cardiovascular:  Negative for chest pain and palpitations.       Elevated blood pressure atbeginning of visit   Gastrointestinal:  Negative for constipation, diarrhea, nausea and vomiting.  Endocrine: Negative for cold intolerance, heat intolerance, polydipsia and polyuria.  Genitourinary:  Negative for dysuria, frequency and urgency.  Musculoskeletal:  Negative for back pain and myalgias.  Skin:  Negative for rash.  Allergic/Immunologic: Negative for environmental allergies.  Neurological:  Negative for dizziness, weakness and headaches.  Psychiatric/Behavioral:  The patient is not nervous/anxious.    Objective:   Today's Vitals   07/20/21 0834 07/20/21 0917  BP: (!) 147/83 138/82  Pulse: 76   Temp: 98.1 F (36.7 C)   SpO2: 96%   Weight: 184 lb 12.8 oz (83.8 kg)   Height:  _0  (1.702 m)   Body mass index is 28.94 kg/m.   Physical Exam Vitals and nursing  note reviewed.  Constitutional:      Appearance: Normal appearance. He is well-developed.  HENT:     Head: Normocephalic and atraumatic.     Mouth/Throat:     Mouth: Mucous membranes are moist.     Pharynx: Oropharynx is clear.  Eyes:     Extraocular Movements: Extraocular movements intact.     Conjunctiva/sclera: Conjunctivae normal.     Pupils: Pupils are equal, round, and reactive to light.  Cardiovascular:     Rate and Rhythm: Normal rate and regular rhythm.     Pulses: Normal pulses.     Heart sounds: Normal heart sounds.  Pulmonary:     Effort: Pulmonary effort is normal.     Breath sounds: Normal breath  sounds.  Abdominal:     General: Bowel sounds are normal. There is no distension.     Palpations: Abdomen is soft. There is no mass.     Tenderness: There is abdominal tenderness. There is no right CVA tenderness, left CVA tenderness, guarding or rebound.     Hernia: No hernia is present.     Comments: Mild epigastric tenderness present during physical exam.   Musculoskeletal:        General: Normal range of motion.     Cervical back: Normal range of motion and neck supple.  Lymphadenopathy:     Cervical: No cervical adenopathy.  Skin:    General: Skin is warm and dry.     Capillary Refill: Capillary refill takes less than 2 seconds.  Neurological:     General: No focal deficit present.     Mental Status: He is alert and oriented to person, place, and time.  Psychiatric:        Mood and Affect: Mood normal.        Behavior: Behavior normal.        Thought Content: Thought content normal.        Judgment: Judgment normal.    Assessment & Plan:  1. Encounter to establish care Appointment today to establish new primary care provider    2. Epigastric abdominal pain Will get ultrasound of abdomen for further evaluation.  - US Abdomen Complete; Future  3. Impaired fasting glucose Check labs including HgbA1c  - Comp Met (CMET); Future - CBC; Future - Lipid panel; Future - Hemoglobin A1c; Future - TSH; Future - Hemoglobin A1c - Lipid panel - CBC - Comp Met (CMET) - TSH  4. Body mass index 28.0-28.9, adult Encourage patient to limit calorie intake to 2000 cal/day or less.  He should consume a low cholesterol, low-fat diet.  Patient should incorporate exercise into his daily routine.   5. Healthcare maintenance Routine, fasting labs obtained during today's visit  - Comp Met (CMET); Future - CBC; Future - Lipid panel; Future - Hemoglobin A1c; Future - TSH; Future - Hemoglobin A1c - Lipid panel - CBC - Comp Met (CMET) - TSH   Problem List Items Addressed This Visit        Other   Epigastric abdominal pain   Relevant Orders   US Abdomen Complete   Body mass index 28.0-28.9, adult   Other Visit Diagnoses     Encounter to establish care    -  Primary   Impaired fasting glucose       Relevant Orders   Comp Met (CMET) (Completed)   CBC (Completed)   Lipid panel (Completed)   Hemoglobin A1c (Completed)   TSH (Completed)   Healthcare maintenance  Relevant Orders   Comp Met (CMET) (Completed)   CBC (Completed)   Lipid panel (Completed)   Hemoglobin A1c (Completed)   TSH (Completed)       Outpatient Encounter Medications as of 07/20/2021  Medication Sig   amLODipine-olmesartan (AZOR) 5-20 MG tablet Take 1 tablet by mouth daily.   docusate sodium (COLACE) 100 MG capsule Take 1 capsule (100 mg total) by mouth every 12 (twelve) hours.   dorzolamide-timolol (COSOPT) 22.3-6.8 MG/ML ophthalmic solution 1 drop 2 (two) times daily.   etodolac (LODINE) 500 MG tablet Take 500 mg by mouth 2 (two) times daily.   latanoprost (XALATAN) 0.005 % ophthalmic solution SMARTSIG:1 Drop(s) In Eye(s) Every Evening   tamsulosin (FLOMAX) 0.4 MG CAPS capsule Take 0.4 mg by mouth daily.   baclofen (LIORESAL) 10 MG tablet Take 10 mg by mouth 2 (two) times daily as needed.   finasteride (PROSCAR) 5 MG tablet Take 5 mg by mouth daily.   gabapentin (NEURONTIN) 300 MG capsule Take 300 mg by mouth 2 (two) times daily. (Patient not taking: Reported on 07/20/2021)   zonisamide (ZONEGRAN) 25 MG capsule Take 100 mg by mouth daily.   No facility-administered encounter medications on file as of 07/20/2021.    Follow-up: Return in about 4 weeks (around 08/17/2021) for health maintenance exam - we need to get medical records from previous pcp .   Ronnell Freshwater, NP

## 2021-07-21 LAB — COMPREHENSIVE METABOLIC PANEL
ALT: 28 IU/L (ref 0–44)
AST: 20 IU/L (ref 0–40)
Albumin/Globulin Ratio: 2.6 — ABNORMAL HIGH (ref 1.2–2.2)
Albumin: 4.7 g/dL (ref 3.8–4.8)
Alkaline Phosphatase: 87 IU/L (ref 44–121)
BUN/Creatinine Ratio: 6 — ABNORMAL LOW (ref 10–24)
BUN: 7 mg/dL — ABNORMAL LOW (ref 8–27)
Bilirubin Total: 0.6 mg/dL (ref 0.0–1.2)
CO2: 23 mmol/L (ref 20–29)
Calcium: 9.8 mg/dL (ref 8.6–10.2)
Chloride: 103 mmol/L (ref 96–106)
Creatinine, Ser: 1.08 mg/dL (ref 0.76–1.27)
Globulin, Total: 1.8 g/dL (ref 1.5–4.5)
Glucose: 90 mg/dL (ref 70–99)
Potassium: 4.2 mmol/L (ref 3.5–5.2)
Sodium: 139 mmol/L (ref 134–144)
Total Protein: 6.5 g/dL (ref 6.0–8.5)
eGFR: 78 mL/min/{1.73_m2} (ref >59)

## 2021-07-21 LAB — LIPID PANEL
Chol/HDL Ratio: 4.2 ratio (ref 0.0–5.0)
Cholesterol, Total: 191 mg/dL (ref 100–199)
HDL: 45 mg/dL (ref >39)
LDL Chol Calc (NIH): 110 mg/dL — ABNORMAL HIGH (ref 0–99)
Triglycerides: 207 mg/dL — ABNORMAL HIGH (ref 0–149)
VLDL Cholesterol Cal: 36 mg/dL (ref 5–40)

## 2021-07-21 LAB — CBC
Hematocrit: 47.2 % (ref 37.5–51.0)
Hemoglobin: 16 g/dL (ref 13.0–17.7)
MCH: 28.4 pg (ref 26.6–33.0)
MCHC: 33.9 g/dL (ref 31.5–35.7)
MCV: 84 fL (ref 79–97)
Platelets: 182 10*3/uL (ref 150–450)
RBC: 5.64 x10E6/uL (ref 4.14–5.80)
RDW: 12.5 % (ref 11.6–15.4)
WBC: 5.3 10*3/uL (ref 3.4–10.8)

## 2021-07-21 LAB — HEMOGLOBIN A1C
Est. average glucose Bld gHb Est-mCnc: 111 mg/dL
Hgb A1c MFr Bld: 5.5 % (ref 4.8–5.6)

## 2021-07-21 LAB — TSH: TSH: 1.57 u[IU]/mL (ref 0.450–4.500)

## 2021-07-25 NOTE — Progress Notes (Signed)
Labs good overall. Waiting on abdominal ultrasound.

## 2021-07-29 DIAGNOSIS — R1013 Epigastric pain: Secondary | ICD-10-CM | POA: Insufficient documentation

## 2021-07-29 DIAGNOSIS — Z6828 Body mass index (BMI) 28.0-28.9, adult: Secondary | ICD-10-CM | POA: Insufficient documentation

## 2021-07-29 DIAGNOSIS — Z6829 Body mass index (BMI) 29.0-29.9, adult: Secondary | ICD-10-CM | POA: Insufficient documentation

## 2021-07-29 NOTE — Patient Instructions (Signed)
Fat and Cholesterol Restricted Eating Plan Getting too much fat and cholesterol in your diet may cause health problems. Choosing the right foods helps keep your fat and cholesterol at normal levels. This can keep you from getting certain diseases. Your doctor may recommend an eating plan that includes: Total fat: ______% or less of total calories a day. This is ______g of fat a day. Saturated fat: ______% or less of total calories a day. This is ______g of saturated fat a day. Cholesterol: less than _________mg a day. Fiber: ______g a day. What are tips for following this plan? General tips Work with your doctor to lose weight if you need to. Avoid: Foods with added sugar. Fried foods. Foods with trans fat or partially hydrogenated oils. This includes some margarines and baked goods. If you drink alcohol: Limit how much you have to: 0-1 drink a day for women who are not pregnant. 0-2 drinks a day for men. Know how much alcohol is in a drink. In the [place] one drink equals one 12 oz bottle of beer (355 mL), one 5 oz glass of wine (148 mL), or one 1 oz glass of hard liquor (44 mL). Reading food labels Check food labels for: Trans fats. Partially hydrogenated oils. Saturated fat (g) in each serving. Cholesterol (mg) in each serving. Fiber (g) in each serving. Choose foods with healthy fats, such as: Monounsaturated fats and polyunsaturated fats. These include olive and canola oil, flaxseeds, walnuts, almonds, and seeds. Omega-3 fats. These are found in certain fish, flaxseed oil, and ground flaxseeds. Choose grain products that have whole grains. Look for the word "whole" as the first word in the ingredient list. Cooking Cook foods using low-fat methods. These include baking, boiling, grilling, and broiling. Eat more home-cooked foods. Eat at restaurants and buffets less often. Eat less fast food. Avoid cooking using saturated fats, such as butter, cream, palm oil, palm kernel oil, and  coconut oil. Meal planning  At meals, divide your plate into four equal parts: Fill one-half of your plate with vegetables, green salads, and fruit. Fill one-fourth of your plate with whole grains. Fill one-fourth of your plate with low-fat (lean) protein foods. Eat fish that is high in omega-3 fats at least two times a week. This includes mackerel, tuna, sardines, and salmon. Eat foods that are high in fiber, such as whole grains, beans, apples, pears, berries, broccoli, carrots, peas, and barley. What foods should I eat? Fruits All fresh, canned (in natural juice), or frozen fruits. Vegetables Fresh or frozen vegetables (raw, steamed, roasted, or grilled). Green salads. Grains Whole grains, such as whole wheat or whole grain breads, crackers, cereals, and pasta. Unsweetened oatmeal, bulgur, barley, quinoa, or brown rice. Corn or whole wheat flour tortillas. Meats and other protein foods Ground beef (85% or leaner), grass-fed beef, or beef trimmed of fat. Skinless chicken or turkey. Ground chicken or turkey. Pork trimmed of fat. All fish and seafood. Egg whites. Dried beans, peas, or lentils. Unsalted nuts or seeds. Unsalted canned beans. Nut butters without added sugar or oil. Dairy Low-fat or nonfat dairy products, such as skim or 1% milk, 2% or reduced-fat cheeses, low-fat and fat-free ricotta or cottage cheese, or plain low-fat and nonfat yogurt. Fats and oils Tub margarine without trans fats. Light or reduced-fat mayonnaise and salad dressings. Avocado. Olive, canola, sesame, or safflower oils. The items listed above may not be a complete list of foods and beverages you can eat. Contact a dietitian for more information. What foods   should I avoid? Fruits Canned fruit in heavy syrup. Fruit in cream or butter sauce. Fried fruit. Vegetables Vegetables cooked in cheese, cream, or butter sauce. Fried vegetables. Grains White bread. White pasta. White rice. Cornbread. Bagels, pastries,  and croissants. Crackers and snack foods that contain trans fat and hydrogenated oils. Meats and other protein foods Fatty cuts of meat. Ribs, chicken wings, bacon, sausage, bologna, salami, chitterlings, fatback, hot dogs, bratwurst, and packaged lunch meats. Liver and organ meats. Whole eggs and egg yolks. Chicken and turkey with skin. Fried meat. Dairy Whole or 2% milk, cream, half-and-half, and cream cheese. Whole milk cheeses. Whole-fat or sweetened yogurt. Full-fat cheeses. Nondairy creamers and whipped toppings. Processed cheese, cheese spreads, and cheese curds. Fats and oils Butter, stick margarine, lard, shortening, ghee, or bacon fat. Coconut, palm kernel, and palm oils. Beverages Alcohol. Sugar-sweetened drinks such as sodas, lemonade, and fruit drinks. Sweets and desserts Corn syrup, sugars, honey, and molasses. Candy. Jam and jelly. Syrup. Sweetened cereals. Cookies, pies, cakes, donuts, muffins, and ice cream. The items listed above may not be a complete list of foods and beverages you should avoid. Contact a dietitian for more information. Summary Choosing the right foods helps keep your fat and cholesterol at normal levels. This can keep you from getting certain diseases. At meals, fill one-half of your plate with vegetables, green salads, and fruits. Eat high fiber foods, like whole grains, beans, apples, pears, berries, carrots, peas, and barley. Limit added sugar, saturated fats, alcohol, and fried foods. This information is not intended to replace advice given to you by your health care provider. Make sure you discuss any questions you have with your health care provider. Document Revised: 12/30/2020 Document Reviewed: 12/30/2020 [employer] Patient Education  2022 [employer]  

## 2021-08-03 ENCOUNTER — Ambulatory Visit
Admission: RE | Admit: 2021-08-03 | Discharge: 2021-08-03 | Disposition: A | Discharge status: Home / Self Care | Payer: BC Managed Care – PPO | Source: Ambulatory Visit | Attending: Nurse Practitioner | Admitting: Nurse Practitioner

## 2021-08-03 DIAGNOSIS — K8689 Other specified diseases of pancreas: Secondary | ICD-10-CM | POA: Diagnosis not present

## 2021-08-03 DIAGNOSIS — R1011 Right upper quadrant pain: Secondary | ICD-10-CM | POA: Diagnosis not present

## 2021-08-03 DIAGNOSIS — R1013 Epigastric pain: Secondary | ICD-10-CM | POA: Diagnosis not present

## 2021-08-03 NOTE — Progress Notes (Signed)
?  chronic pancreatitis vs pancreatic mass. Consider CT. Discuss with patient at visit 08/16/2021

## 2021-08-16 ENCOUNTER — Ambulatory Visit (INDEPENDENT_AMBULATORY_CARE_PROVIDER_SITE_OTHER): Payer: BC Managed Care – PPO | Admitting: Nurse Practitioner

## 2021-08-16 ENCOUNTER — Encounter: Payer: Self-pay | Admitting: Nurse Practitioner

## 2021-08-16 ENCOUNTER — Other Ambulatory Visit: Payer: Self-pay

## 2021-08-16 VITALS — BP 150/88 | HR 81 | Temp 98.7°F | Ht 67.0 in | Wt 183.0 lb

## 2021-08-16 DIAGNOSIS — M064 Inflammatory polyarthropathy: Secondary | ICD-10-CM | POA: Diagnosis not present

## 2021-08-16 DIAGNOSIS — E782 Mixed hyperlipidemia: Secondary | ICD-10-CM

## 2021-08-16 DIAGNOSIS — Z0001 Encounter for general adult medical examination with abnormal findings: Secondary | ICD-10-CM | POA: Diagnosis not present

## 2021-08-16 DIAGNOSIS — D378 Neoplasm of uncertain behavior of other specified digestive organs: Secondary | ICD-10-CM

## 2021-08-16 DIAGNOSIS — R1013 Epigastric pain: Secondary | ICD-10-CM

## 2021-08-16 DIAGNOSIS — I10 Essential (primary) hypertension: Secondary | ICD-10-CM | POA: Insufficient documentation

## 2021-08-16 DIAGNOSIS — Z6828 Body mass index (BMI) 28.0-28.9, adult: Secondary | ICD-10-CM

## 2021-08-16 MED ORDER — AMLODIPINE-OLMESARTAN 5-20 MG PO TABS: 1.0000 | ORAL_TABLET | Freq: Every day | ORAL | 1 refills | Status: DC

## 2021-08-16 MED ORDER — ETODOLAC 500 MG PO TABS: ORAL_TABLET | ORAL | 2 refills | Status: DC

## 2021-08-16 NOTE — Progress Notes (Signed)
Established Patient Office Visit  Subjective:  Patient ID: Howard Faulkner, male    DOB: [birth year]  Age: 60 y.o. MRN: [id number]  CC:  Chief Complaint  Patient presents with   Annual Exam    HPI Howard Faulkner presents for annual wellness visit. He continues to have sharp pain in right flank which radiates around to the right upper back. Pain is more severe first thing in in the morning when he gets up. Bending backwards causes this to hurt more. Riding in a care on a bumpy road also increases the pain. The pain is described as sharp. This started about four months ago. Started after exertional activity that was unusual and has not stopped. he did have ultrasound of the abdomen prior to this visit. There is evidence of hepatic steatosis. There was also dilation of the pancreatic duct with possible calcification of the duct. A mass could not be ruled out and CT scan of the abdomen has been recommended for further evaluation.  He did have fasting labs done as well. There is mild elevation of LDL cholesterol and triglycerides.   Lipid Panel     Component Value Date/Time   CHOL 191 07/20/2021 0926   TRIG 207 (H) 07/20/2021 0926   HDL 45 07/20/2021 0926   CHOLHDL 4.2 07/20/2021 0926   LDLCALC 110 (H) 07/20/2021 0926   LABVLDL 36 07/20/2021 0926    Other lab work was essentially normal.  Blood pressure is elevated today. He does have complaint of intermittent headache .  Past Medical History:  Diagnosis Date   Hypertension     History reviewed. No pertinent surgical history.  Family History  Problem Relation Age of Onset   High blood pressure Sister     Social History   Socioeconomic History   Marital status: Married    Spouse name: Not on file   Number of children: Not on file   Years of education: Not on file   Highest education level: Not on file  Occupational History   Not on file  Tobacco Use   Smoking status: Never   Smokeless tobacco: Never  Substance and  Sexual Activity   Alcohol use: Yes    Comment: occasional   Drug use: No   Sexual activity: Yes    Partners: Female  Other Topics Concern   Not on file  Social History Narrative   Not on file   Social Determinants of Health   Financial Resource Strain: Not on file  Food Insecurity: Not on file  Transportation Needs: Not on file  Physical Activity: Not on file  Stress: Not on file  Social Connections: Not on file  Intimate Partner Violence: Not on file    Outpatient Medications Prior to Visit  Medication Sig Dispense Refill   baclofen (LIORESAL) 10 MG tablet Take 10 mg by mouth 2 (two) times daily as needed.     finasteride (PROSCAR) 5 MG tablet Take 5 mg by mouth daily.     docusate sodium (COLACE) 100 MG capsule Take 1 capsule (100 mg total) by mouth every 12 (twelve) hours. (Patient not taking: Reported on 08/16/2021) 60 capsule 0   dorzolamide-timolol (COSOPT) 22.3-6.8 MG/ML ophthalmic solution 1 drop 2 (two) times daily. (Patient not taking: Reported on 08/16/2021)     gabapentin (NEURONTIN) 300 MG capsule Take 300 mg by mouth 2 (two) times daily. (Patient not taking: Reported on 07/20/2021)     latanoprost (XALATAN) 0.005 % ophthalmic solution SMARTSIG:1 Drop(s) In Eye(s) Every Evening (  Patient not taking: Reported on 08/16/2021)     tamsulosin (FLOMAX) 0.4 MG CAPS capsule Take 0.4 mg by mouth daily. (Patient not taking: Reported on 08/16/2021)     zonisamide (ZONEGRAN) 25 MG capsule Take 100 mg by mouth daily. (Patient not taking: Reported on 08/16/2021)     amLODipine-olmesartan (AZOR) 5-20 MG tablet Take 1 tablet by mouth daily. (Patient not taking: Reported on 08/16/2021)     etodolac (LODINE) 500 MG tablet Take 500 mg by mouth 2 (two) times daily. (Patient not taking: Reported on 08/16/2021)     No facility-administered medications prior to visit.    No Known Allergies  ROS Review of Systems  Constitutional:  Positive for fatigue. Negative for activity change,  chills and fever.  HENT:  Negative for congestion, postnasal drip, rhinorrhea, sinus pressure, sinus pain, sneezing and sore throat.   Eyes: Negative.   Respiratory:  Negative for cough, shortness of breath and wheezing.   Cardiovascular:  Negative for chest pain and palpitations.       Elevated blood pressure. Patient reports that he is not taking blood pressure medication.   Gastrointestinal:  Positive for abdominal pain and constipation. Negative for diarrhea, nausea and vomiting.  Endocrine: Negative for cold intolerance, heat intolerance, polydipsia and polyuria.  Genitourinary:  Negative for dysuria, frequency and urgency.  Musculoskeletal:  Positive for arthralgias and myalgias. Negative for back pain.  Skin:  Negative for rash.  Allergic/Immunologic: Negative for environmental allergies.  Neurological:  Negative for dizziness, weakness and headaches.  Psychiatric/Behavioral:  The patient is not nervous/anxious.      Objective:    Physical Exam Vitals and nursing note reviewed.  Constitutional:      Appearance: Normal appearance. He is well-developed.  HENT:     Head: Normocephalic and atraumatic.     Right Ear: Tympanic membrane, ear canal and external ear normal.     Left Ear: Tympanic membrane, ear canal and external ear normal.     Ears:     Comments: Mild erythema in bilateral outer ear canals     Nose: Nose normal.     Mouth/Throat:     Mouth: Mucous membranes are moist.  Eyes:     Extraocular Movements: Extraocular movements intact.     Conjunctiva/sclera: Conjunctivae normal.     Pupils: Pupils are equal, round, and reactive to light.  Neck:     Vascular: No carotid bruit.  Cardiovascular:     Rate and Rhythm: Normal rate and regular rhythm.     Pulses: Normal pulses.     Heart sounds: Normal heart sounds.  Pulmonary:     Effort: Pulmonary effort is normal.     Breath sounds: Normal breath sounds.  Abdominal:     General: Bowel sounds are normal.      Palpations: Abdomen is soft. There is no mass.     Tenderness: There is abdominal tenderness. There is no rebound.     Hernia: No hernia is present.     Comments: More severe tenderness in RUQ area of the abdomen and periumbilical area as well.   Musculoskeletal:        General: Normal range of motion.     Cervical back: Normal range of motion and neck supple.  Lymphadenopathy:     Cervical: No cervical adenopathy.  Skin:    General: Skin is warm and dry.     Capillary Refill: Capillary refill takes less than 2 seconds.  Neurological:     General: No focal  deficit present.     Mental Status: He is alert and oriented to person, place, and time.  Psychiatric:        Mood and Affect: Mood normal.        Behavior: Behavior normal.        Thought Content: Thought content normal.        Judgment: Judgment normal.   Today's Vitals   08/16/21 0834 08/16/21 0923  BP: (!) 161/98 (!) 150/88  Pulse: 81   Temp: 98.7 F (37.1 C)   SpO2: 99%   Weight: 183 lb (83 kg)   Height: 5' 7"  (1.702 m)    Body mass index is 28.66 kg/m.  Wt Readings from Last 3 Encounters:  08/16/21 183 lb (83 kg)  07/20/21 184 lb 12.8 oz (83.8 kg)     Health Maintenance Due  Topic Date Due   COVID-19 Vaccine (1) Never done   HIV Screening  Never done   Hepatitis C Screening  Never done   TETANUS/TDAP  Never done   Zoster Vaccines- Shingrix (1 of 2) Never done   COLONOSCOPY (Pts 45-24yr Insurance coverage will need to be confirmed)  Never done   INFLUENZA VACCINE  Never done    There are no preventive care reminders to display for this patient.  Lab Results  Component Value Date   TSH 1.570 07/20/2021   Lab Results  Component Value Date   WBC 5.3 07/20/2021   HGB 16.0 07/20/2021   HCT 47.2 07/20/2021   MCV 84 07/20/2021   PLT 182 07/20/2021   Lab Results  Component Value Date   NA 139 07/20/2021   K 4.2 07/20/2021   CO2 23 07/20/2021   GLUCOSE 90 07/20/2021   BUN 7 (L) 07/20/2021    CREATININE 1.08 07/20/2021   BILITOT 0.6 07/20/2021   ALKPHOS 87 07/20/2021   AST 20 07/20/2021   ALT 28 07/20/2021   PROT 6.5 07/20/2021   ALBUMIN 4.7 07/20/2021   CALCIUM 9.8 07/20/2021   ANIONGAP 6 03/17/2021   EGFR 78 07/20/2021   Lab Results  Component Value Date   CHOL 191 07/20/2021   Lab Results  Component Value Date   HDL 45 07/20/2021   Lab Results  Component Value Date   LDLCALC 110 (H) 07/20/2021   Lab Results  Component Value Date   TRIG 207 (H) 07/20/2021   Lab Results  Component Value Date   CHOLHDL 4.2 07/20/2021   Lab Results  Component Value Date   HGBA1C 5.5 07/20/2021      Assessment & Plan:  1. Encounter for general adult medical examination with abnormal findings Annual wellness visit today   2. Epigastric abdominal pain Ultrasound showing hepatic steatosis and dilation of pancreatic duct. Will get CT abdomen and pelvis for further evaluation.  - CT Abdomen Pelvis W Contrast; Future  3. 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 Will get CT abdomen and pelvis for further evaluation.  - CT Abdomen Pelvis W Contrast; Future  4. Inflammatory polyarthritis (HCC) Trial etodolac 5050mup to twice daily as needed for joint pain and inflammation.  - etodolac (LODINE) 500 MG tablet; Take 1 tablet po BID as needed for joint pain  Dispense: 60 tablet; Refill: 2  5. Essential hypertension Restart amlodipine/olmesartan 5/2078mablets daily. Recommend DASH diet. Increase water intake. Follow up three weeks  - amLODipine-olmesartan (AZOR) 5-20 MG tablet; Take 1 tablet by mouth daily. For hypertension  Dispense: 90 tablet; Refill: 1  6. Mixed hyperlipidemia Reviewed lipid panel. Mild elevation of LDL and triglycerides. Recommend limiting intake of fried and fatty foods. He should increase physical activity as tolerated   7. Body mass index 28.0-28.9, adult Encourage patient  to limit calorie intake to 2000 cal/day or less.  He should consume a low cholesterol, low-fat diet.  Patient should incorporate exercise into his daily routine.    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   Relevant Orders   CT Abdomen Pelvis W Contrast     Musculoskeletal and Integument   Inflammatory polyarthritis (HCC)   Relevant Medications   etodolac (LODINE) 500 MG tablet     Other   Epigastric abdominal pain   Relevant Orders   CT Abdomen Pelvis W Contrast   Body mass index 28.0-28.9, adult   Mixed hyperlipidemia   Relevant Medications   amLODipine-olmesartan (AZOR) 5-20 MG tablet   Other Visit Diagnoses     Encounter for general adult medical examination with abnormal findings    -  Primary       Meds ordered this encounter  Medications   amLODipine-olmesartan (AZOR) 5-20 MG tablet    Sig: Take 1 tablet by mouth daily. For hypertension    Dispense:  90 tablet    Refill:  1    Order Specific Question:   Supervising Provider    Answer:   Beatrice Lecher D [[id number]]   etodolac (LODINE) 500 MG tablet    Sig: Take 1 tablet po BID as needed for joint pain    Dispense:  60 tablet    Refill:  2    Order Specific Question:   Supervising Provider    Answer:   Beatrice Lecher D [[id number]]     Follow-up: Return in about 3 weeks (around 09/06/2021).    Ronnell Freshwater, NP

## 2021-08-16 NOTE — Patient Instructions (Addendum)
Fat and Cholesterol Restricted Eating Plan Getting too much fat and cholesterol in your diet may cause health problems. Choosing the right foods helps keep your fat and cholesterol at normal levels. This can keep you from getting certain diseases. Your doctor may recommend an eating plan that includes: Total fat: ______% or less of total calories a day. This is ______g of fat a day. Saturated fat: ______% or less of total calories a day. This is ______g of saturated fat a day. Cholesterol: less than _________mg a day. Fiber: ______g a day. What are tips for following this plan? General tips Work with your doctor to lose weight if you need to. Avoid: Foods with added sugar. Fried foods. Foods with trans fat or partially hydrogenated oils. This includes some margarines and baked goods. If you drink alcohol: Limit how much you have to: 0-1 drink a day for women who are not pregnant. 0-2 drinks a day for men. Know how much alcohol is in a drink. In the [place] one drink equals one 12 oz bottle of beer (355 mL), one 5 oz glass of wine (148 mL), or one 1 oz glass of hard liquor (44 mL). Reading food labels Check food labels for: Trans fats. Partially hydrogenated oils. Saturated fat (g) in each serving. Cholesterol (mg) in each serving. Fiber (g) in each serving. Choose foods with healthy fats, such as: Monounsaturated fats and polyunsaturated fats. These include olive and canola oil, flaxseeds, walnuts, almonds, and seeds. Omega-3 fats. These are found in certain fish, flaxseed oil, and ground flaxseeds. Choose grain products that have whole grains. Look for the word "whole" as the first word in the ingredient list. Cooking Cook foods using low-fat methods. These include baking, boiling, grilling, and broiling. Eat more home-cooked foods. Eat at restaurants and buffets less often. Eat less fast food. Avoid cooking using saturated fats, such as butter, cream, palm oil, palm kernel oil, and  coconut oil. Meal planning  At meals, divide your plate into four equal parts: Fill one-half of your plate with vegetables, green salads, and fruit. Fill one-fourth of your plate with whole grains. Fill one-fourth of your plate with low-fat (lean) protein foods. Eat fish that is high in omega-3 fats at least two times a week. This includes mackerel, tuna, sardines, and salmon. Eat foods that are high in fiber, such as whole grains, beans, apples, pears, berries, broccoli, carrots, peas, and barley. What foods should I eat? Fruits All fresh, canned (in natural juice), or frozen fruits. Vegetables Fresh or frozen vegetables (raw, steamed, roasted, or grilled). Green salads. Grains Whole grains, such as whole wheat or whole grain breads, crackers, cereals, and pasta. Unsweetened oatmeal, bulgur, barley, quinoa, or brown rice. Corn or whole wheat flour tortillas. Meats and other protein foods Ground beef (85% or leaner), grass-fed beef, or beef trimmed of fat. Skinless chicken or turkey. Ground chicken or turkey. Pork trimmed of fat. All fish and seafood. Egg whites. Dried beans, peas, or lentils. Unsalted nuts or seeds. Unsalted canned beans. Nut butters without added sugar or oil. Dairy Low-fat or nonfat dairy products, such as skim or 1% milk, 2% or reduced-fat cheeses, low-fat and fat-free ricotta or cottage cheese, or plain low-fat and nonfat yogurt. Fats and oils Tub margarine without trans fats. Light or reduced-fat mayonnaise and salad dressings. Avocado. Olive, canola, sesame, or safflower oils. The items listed above may not be a complete list of foods and beverages you can eat. Contact a dietitian for more information. What foods   should I avoid? Fruits Canned fruit in heavy syrup. Fruit in cream or butter sauce. Fried fruit. Vegetables Vegetables cooked in cheese, cream, or butter sauce. Fried vegetables. Grains White bread. White pasta. White rice. Cornbread. Bagels, pastries,  and croissants. Crackers and snack foods that contain trans fat and hydrogenated oils. Meats and other protein foods Fatty cuts of meat. Ribs, chicken wings, bacon, sausage, bologna, salami, chitterlings, fatback, hot dogs, bratwurst, and packaged lunch meats. Liver and organ meats. Whole eggs and egg yolks. Chicken and turkey with skin. Fried meat. Dairy Whole or 2% milk, cream, half-and-half, and cream cheese. Whole milk cheeses. Whole-fat or sweetened yogurt. Full-fat cheeses. Nondairy creamers and whipped toppings. Processed cheese, cheese spreads, and cheese curds. Fats and oils Butter, stick margarine, lard, shortening, ghee, or bacon fat. Coconut, palm kernel, and palm oils. Beverages Alcohol. Sugar-sweetened drinks such as sodas, lemonade, and fruit drinks. Sweets and desserts Corn syrup, sugars, honey, and molasses. Candy. Jam and jelly. Syrup. Sweetened cereals. Cookies, pies, cakes, donuts, muffins, and ice cream. The items listed above may not be a complete list of foods and beverages you should avoid. Contact a dietitian for more information. Summary Choosing the right foods helps keep your fat and cholesterol at normal levels. This can keep you from getting certain diseases. At meals, fill one-half of your plate with vegetables, green salads, and fruits. Eat high fiber foods, like whole grains, beans, apples, pears, berries, carrots, peas, and barley. Limit added sugar, saturated fats, alcohol, and fried foods. This information is not intended to replace advice given to you by your health care provider. Make sure you discuss any questions you have with your health care provider. Document Revised: 12/30/2020 Document Reviewed: 12/30/2020 Elsevier Patient Education  2022 [employer]  

## 2021-08-22 ENCOUNTER — Telehealth: Payer: Self-pay | Admitting: Nurse Practitioner

## 2021-08-22 NOTE — Telephone Encounter (Signed)
[facility] left a voicemail stating his CT had been denied per insurance. Please call Latonya at [phone] ext [phone]. Thank you

## 2021-08-22 NOTE — Telephone Encounter (Signed)
Called [facility] to contact the office

## 2021-08-23 ENCOUNTER — Inpatient Hospital Stay: Admission: RE | Admit: 2021-08-23 | Payer: BC Managed Care – PPO | Source: Ambulatory Visit

## 2021-08-23 NOTE — Telephone Encounter (Signed)
Called [place] from [facility] to contact office

## 2021-08-24 NOTE — Telephone Encounter (Signed)
Called pt spoke to daughter she stated that the insurance company needed more information providing his CT imaging

## 2021-09-06 ENCOUNTER — Other Ambulatory Visit: Payer: Self-pay

## 2021-09-06 ENCOUNTER — Ambulatory Visit: Payer: BC Managed Care – PPO | Admitting: Nurse Practitioner

## 2021-09-06 ENCOUNTER — Encounter: Payer: Self-pay | Admitting: Nurse Practitioner

## 2021-09-06 VITALS — BP 146/81 | HR 79 | Temp 97.9°F | Ht 67.0 in | Wt 183.8 lb

## 2021-09-06 DIAGNOSIS — R1013 Epigastric pain: Secondary | ICD-10-CM | POA: Diagnosis not present

## 2021-09-06 DIAGNOSIS — Z6828 Body mass index (BMI) 28.0-28.9, adult: Secondary | ICD-10-CM | POA: Diagnosis not present

## 2021-09-06 DIAGNOSIS — I10 Essential (primary) hypertension: Secondary | ICD-10-CM | POA: Diagnosis not present

## 2021-09-06 DIAGNOSIS — D378 Neoplasm of uncertain behavior of other specified digestive organs: Secondary | ICD-10-CM | POA: Diagnosis not present

## 2021-09-06 MED ORDER — AMLODIPINE-OLMESARTAN 5-20 MG PO TABS: 1.0000 | ORAL_TABLET | Freq: Every day | ORAL | 1 refills | Status: DC

## 2021-09-06 NOTE — Progress Notes (Signed)
Established Patient Office Visit  Subjective:  Patient ID: Howard Faulkner, male    DOB: 1961/08/07  Age: 61 y.o. MRN: [id number]  CC:  Chief Complaint  Patient presents with   Follow-up    HPI Howard Faulkner presents for follow up of sharp abdominal pain in right flank which radiates around to the right upper back. Pain is more severe first thing in in the morning when he gets up. Bending backwards causes this to hurt more. Riding in a care on a bumpy road also increases the pain. The pain is described as sharp. This started about four months ago. Started after exertional activity that was unusual and has not stopped. he did have ultrasound of the abdomen prior to this visit. There is evidence of hepatic steatosis. There was also dilation of the pancreatic duct with possible calcification of the duct. A CT scan was ordered during his most recent visit, however, this scan was denied by patient's insurance. Additional blood work was required to be obtained prior to havign CT scan.   Past Medical History:  Diagnosis Date   Hypertension     History reviewed. No pertinent surgical history.  Family History  Problem Relation Age of Onset   High blood pressure Sister     Social History   Socioeconomic History   Marital status: Married    Spouse name: Not on file   Number of children: Not on file   Years of education: Not on file   Highest education level: Not on file  Occupational History   Not on file  Tobacco Use   Smoking status: Never   Smokeless tobacco: Never  Substance and Sexual Activity   Alcohol use: Yes    Comment: occasional   Drug use: No   Sexual activity: Yes    Partners: Female  Other Topics Concern   Not on file  Social History Narrative   Not on file   Social Determinants of Health   Financial Resource Strain: Not on file  Food Insecurity: Not on file  Transportation Needs: Not on file  Physical Activity: Not on file  Stress: Not on file  Social  Connections: Not on file  Intimate Partner Violence: Not on file    Outpatient Medications Prior to Visit  Medication Sig Dispense Refill   baclofen (LIORESAL) 10 MG tablet Take 10 mg by mouth 2 (two) times daily as needed.     etodolac (LODINE) 500 MG tablet Take 1 tablet po BID as needed for joint pain 60 tablet 2   finasteride (PROSCAR) 5 MG tablet Take 5 mg by mouth daily.     amLODipine-olmesartan (AZOR) 5-20 MG tablet Take 1 tablet by mouth daily. For hypertension 90 tablet 1   docusate sodium (COLACE) 100 MG capsule Take 1 capsule (100 mg total) by mouth every 12 (twelve) hours. (Patient not taking: Reported on 08/16/2021) 60 capsule 0   dorzolamide-timolol (COSOPT) 22.3-6.8 MG/ML ophthalmic solution 1 drop 2 (two) times daily. (Patient not taking: Reported on 08/16/2021)     gabapentin (NEURONTIN) 300 MG capsule Take 300 mg by mouth 2 (two) times daily. (Patient not taking: Reported on 07/20/2021)     latanoprost (XALATAN) 0.005 % ophthalmic solution SMARTSIG:1 Drop(s) In Eye(s) Every Evening (Patient not taking: Reported on 08/16/2021)     tamsulosin (FLOMAX) 0.4 MG CAPS capsule Take 0.4 mg by mouth daily. (Patient not taking: Reported on 08/16/2021)     zonisamide (ZONEGRAN) 25 MG capsule Take 100 mg by mouth  Patient not taking: Reported on 08/16/2021)    ° °No facility-administered medications prior to visit.  ° ° °No Known Allergies ° °ROS °Review of Systems  °Constitutional:  Positive for fatigue. Negative for activity change, chills and fever.  °HENT:  Negative for congestion, postnasal drip, rhinorrhea, sinus pressure, sinus pain, sneezing and sore throat.   °Eyes: Negative.   °Respiratory:  Negative for cough, shortness of breath and wheezing.   °Cardiovascular:  Negative for chest pain and palpitations.  °     Elevated blood pressure. Patient reports that he is not taking blood pressure medication.  Prescription not filled by pharmacy given a prescription was sent at last  visit.  °Gastrointestinal:  Positive for abdominal pain and constipation. Negative for diarrhea, nausea and vomiting.  °Endocrine: Negative for cold intolerance, heat intolerance, polydipsia and polyuria.  °Genitourinary:  Negative for dysuria, frequency and urgency.  °Musculoskeletal:  Positive for arthralgias and myalgias. Negative for back pain.  °Skin:  Negative for rash.  °Allergic/Immunologic: Negative for environmental allergies.  °Neurological:  Negative for dizziness, weakness and headaches.  °Psychiatric/Behavioral:  The patient is not nervous/anxious.   ° °  °Objective:  °  °Physical Exam °Vitals and nursing note reviewed.  °Constitutional:   °   Appearance: Normal appearance. He is well-developed.  °HENT:  °   Head: Normocephalic and atraumatic.  °   Nose: Nose normal.  °   Mouth/Throat:  °   Mouth: Mucous membranes are moist.  °Eyes:  °   Extraocular Movements: Extraocular movements intact.  °   Conjunctiva/sclera: Conjunctivae normal.  °   Pupils: Pupils are equal, round, and reactive to light.  °Cardiovascular:  °   Rate and Rhythm: Normal rate and regular rhythm.  °   Pulses: Normal pulses.  °   Heart sounds: Normal heart sounds.  °Pulmonary:  °   Effort: Pulmonary effort is normal.  °   Breath sounds: Normal breath sounds.  °Abdominal:  °   General: Bowel sounds are normal. There is no distension.  °   Palpations: Abdomen is soft. There is no mass.  °   Tenderness: There is abdominal tenderness. There is guarding. There is no rebound.  °   Hernia: No hernia is present.  °Musculoskeletal:     °   General: Normal range of motion.  °   Cervical back: Normal range of motion and neck supple.  °Lymphadenopathy:  °   Cervical: No cervical adenopathy.  °Skin: °   General: Skin is warm and dry.  °   Capillary Refill: Capillary refill takes less than 2 seconds.  °Neurological:  °   General: No focal deficit present.  °   Mental Status: He is alert and oriented to person, place, and time.  °Psychiatric:     °    Mood and Affect: Mood normal.     °   Behavior: Behavior normal.     °   Thought Content: Thought content normal.     °   Judgment: Judgment normal.  ° ° °Today's Vitals  ° 09/06/21 1334 09/06/21 1415  °BP: (!) 149/82 (!) 146/81  °Pulse: 79   °Temp: 97.9 °F (36.6 °C)   °SpO2: 96%   °Weight: 183 lb 12.8 oz (83.4 kg)   °Height: 5' 7" (1.702 m)   ° °Body mass index is 28.79 kg/m².  ° °Wt Readings from Last 3 Encounters:  °09/06/21 183 lb 12.8 oz (83.4 kg)  °08/16/21 183 lb (83 kg)  °  07/20/21 184 lb 12.8 oz (83.8 kg)     Health Maintenance Due  Topic Date Due   COVID-19 Vaccine (1) Never done   COLONOSCOPY (Pts 45-88yr Insurance coverage will need to be confirmed)  Never done    There are no preventive care reminders to display for this patient.  Lab Results  Component Value Date   TSH 1.570 07/20/2021   Lab Results  Component Value Date   WBC 5.3 07/20/2021   HGB 16.0 07/20/2021   HCT 47.2 07/20/2021   MCV 84 07/20/2021   PLT 182 07/20/2021   Lab Results  Component Value Date   NA 139 07/20/2021   K 4.2 07/20/2021   CO2 23 07/20/2021   GLUCOSE 90 07/20/2021   BUN 7 (L) 07/20/2021   CREATININE 1.08 07/20/2021   BILITOT 0.6 07/20/2021   ALKPHOS 87 07/20/2021   AST 20 07/20/2021   ALT 28 07/20/2021   PROT 6.5 07/20/2021   ALBUMIN 4.7 07/20/2021   CALCIUM 9.8 07/20/2021   ANIONGAP 6 03/17/2021   EGFR 78 07/20/2021   Lab Results  Component Value Date   CHOL 191 07/20/2021   Lab Results  Component Value Date   HDL 45 07/20/2021   Lab Results  Component Value Date   LDLCALC 110 (H) 07/20/2021   Lab Results  Component Value Date   TRIG 207 (H) 07/20/2021   Lab Results  Component Value Date   CHOLHDL 4.2 07/20/2021   Lab Results  Component Value Date   HGBA1C 5.5 07/20/2021      Assessment & Plan:  1. Essential hypertension Blood pressure slightly elevated however patient not taking medication at this time.  A new prescription for amlodipine/olmesartan sent  to his pharmacy.  He should take daily. - amLODipine-olmesartan (AZOR) 5-20 MG tablet; Take 1 tablet by mouth daily. For hypertension  Dispense: 90 tablet; Refill: 1  2. Epigastric abdominal pain Check amylase and lipase levels during today's visit. - Amylase - Lipase  3. 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  We will attempt to get new CT scan ordered for further evaluation of biliary ductal dilation. - Amylase - Lipase  4. Body mass index 28.0-28.9, adult Encourage patient to limit calorie intake to 2000 cal/day or less.  He should consume a low cholesterol, low-fat diet.  Patient should incorporate exercise into his daily routine.     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 For hypertension    Dispense:  90 tablet    Refill:  1    Order Specific Question:   Supervising Provider    Answer:   MBeatrice LecherD [[id number]]    Follow-up: Return for prn worsening or persistent symptoms [place] .Marland Kitchen   HRonnell Freshwater NP   This note was dictated using  Dragon Voice Recognition Software. Rapid proofreading was performed to expedite the delivery of the information. Despite proofreading, phonetic errors will occur which are common with this voice recognition software. Please take this into consideration. If there are any concerns, please contact our  office.   °

## 2021-09-07 LAB — AMYLASE: Amylase: 92 U/L (ref 31–110)

## 2021-09-07 LAB — LIPASE: Lipase: 82 U/L — ABNORMAL HIGH (ref 13–78)

## 2021-09-11 NOTE — Progress Notes (Signed)
Please let the patient or family member know that the serum lipase was slightly elevated. If they want, and insurance will approve, I can try to get new CT ordered. Thanks so much.   -HB

## 2021-09-12 ENCOUNTER — Other Ambulatory Visit: Payer: BC Managed Care – PPO

## 2022-06-29 IMAGING — CT CT ANGIO HEAD
2 of 7 series · 8 of 33 positions shown · IV contrast (omnipaque)
Comparison: Noncontrast head CT from earlier the same day.

CLINICAL DATA: Initial evaluation for acute left-sided headache.



[Series 5: cta head neck · axial · 0.49mm/px · z∈[-246,-128]mm · 2 of 179 slices shown]
[im 60/179  soft-tissue]
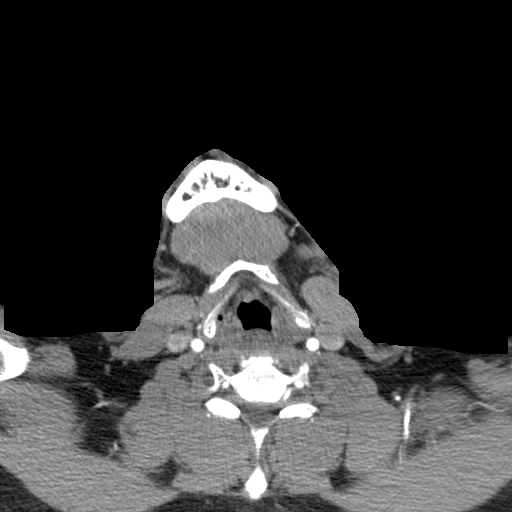
[im 119/179  soft-tissue]
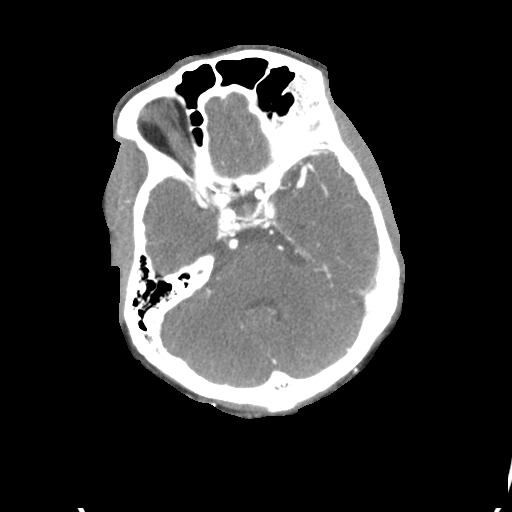

[Series 7: ax thin · axial · 0.38mm/px · z∈[-313,-60]mm · 6 of 355 slices shown]
[im 51/355  soft-tissue]
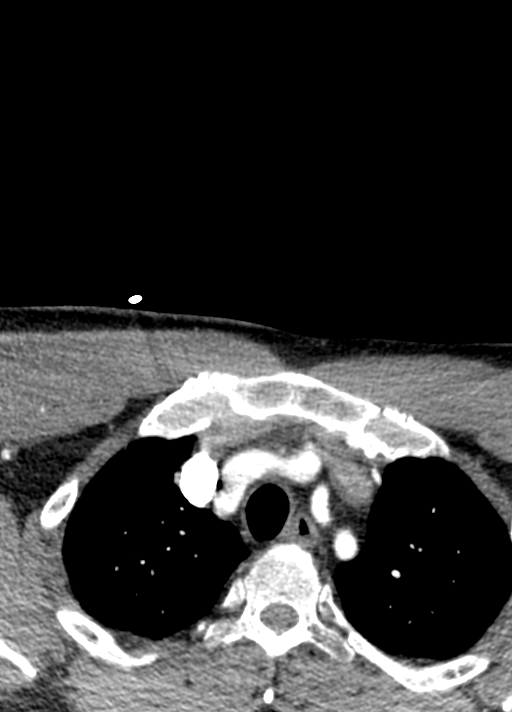
[im 102/355  bone]
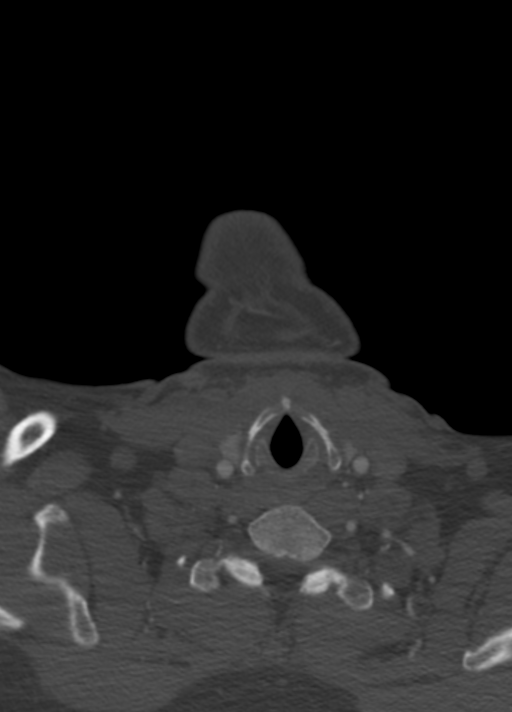
[im 152/355  soft-tissue]
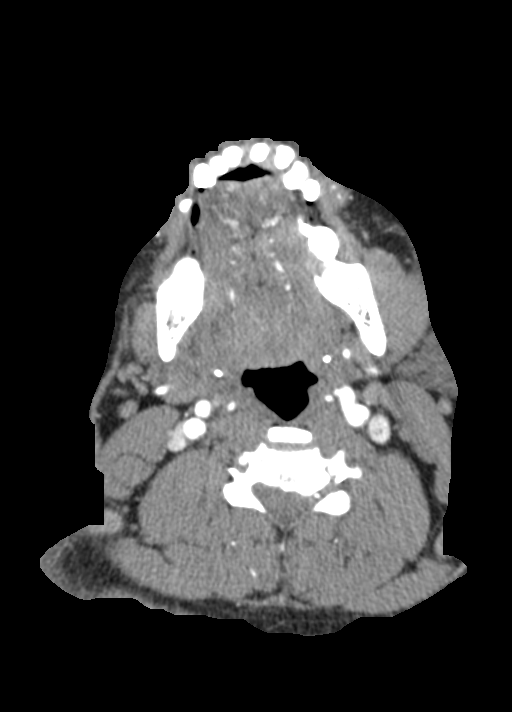
[im 203/355  bone]
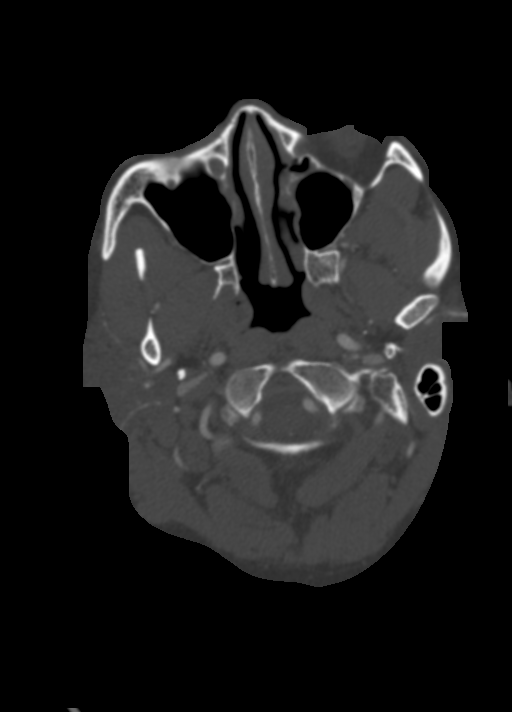
[im 253/355  soft-tissue]
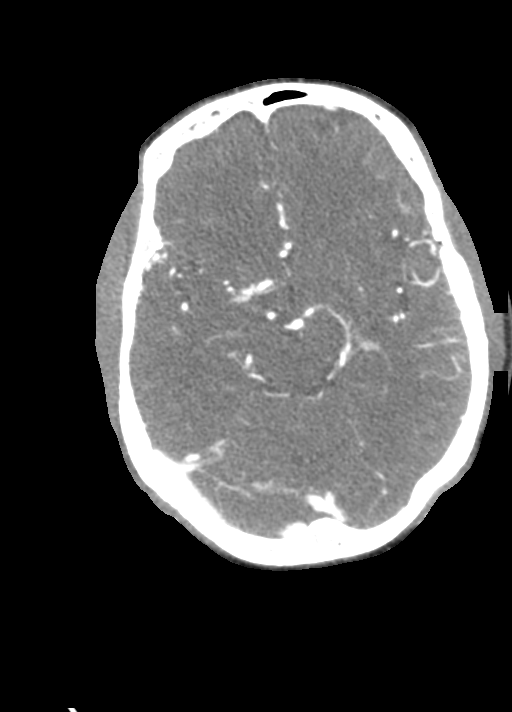
[im 304/355  bone]
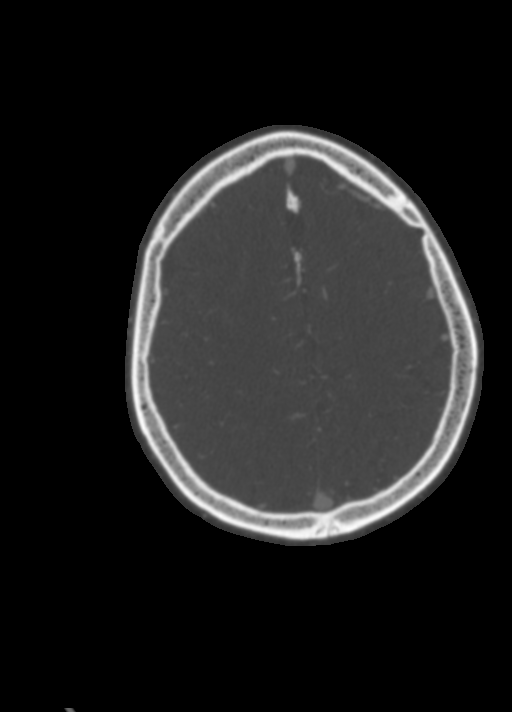

[8 of 33 positions shown; findings below may reference images not displayed]

FINDINGS: CTA NECK FINDINGS

Aortic arch: Visualized aortic arch normal in caliber with normal
branch pattern. Mild atheromatous change within the arch itself. No
hemodynamically significant stenosis seen about the origin the great
vessels.

Right carotid system: Right common and internal carotid arteries
widely patent without stenosis, dissection or occlusion.

Left carotid system: Left common and internal carotid arteries
widely patent without stenosis, dissection or occlusion.

Vertebral arteries: Both vertebral arteries arise from the
subclavian arteries. No proximal subclavian artery stenosis. Both
vertebral arteries widely patent without stenosis, dissection or
occlusion.

Skeleton: No visible acute osseous finding. No discrete or worrisome
osseous lesions.

Other neck: No other acute soft tissue abnormality within the neck.
No mass or adenopathy.

Upper chest: Visualized upper chest demonstrates no acute finding.

Review of the MIP images confirms the above findings

CTA HEAD FINDINGS

Anterior circulation: Both internal carotid arteries widely patent
to the termini without stenosis. A1 segments widely patent. Normal
anterior communicating artery complex. Both anterior cerebral
arteries widely patent to their distal aspects without stenosis. No
M1 stenosis or occlusion. Normal MCA bifurcations. Distal MCA
branches well perfused and symmetric.

Posterior circulation: Both V4 segments patent to the
vertebrobasilar junction without stenosis. Left vertebral artery
slightly dominant. Neither PICA origin well visualized. Short
fenestration at the vertebrobasilar junction noted. Basilar widely
patent distally without stenosis. Superior cerebellar arteries
patent bilaterally. Both PCAs primarily supplied via the basilar
well perfused or distal aspects.

Venous sinuses: Patent allowing for timing of contrast bolus.

Anatomic variants: None significant.  No aneurysm.

Review of the MIP images confirms the above findings
IMPRESSION: Normal CTA of the head and neck. No large vessel occlusion,
hemodynamically significant stenosis, or other acute vascular
abnormality. No aneurysm.

## 2022-11-06 ENCOUNTER — Ambulatory Visit: Payer: BC Managed Care – PPO | Admitting: Nurse Practitioner

## 2022-12-05 ENCOUNTER — Encounter: Payer: Self-pay | Admitting: Nurse Practitioner

## 2022-12-05 ENCOUNTER — Ambulatory Visit (INDEPENDENT_AMBULATORY_CARE_PROVIDER_SITE_OTHER): Payer: Medicare Other | Admitting: Nurse Practitioner

## 2022-12-05 VITALS — BP 153/91 | HR 75 | Ht 67.0 in | Wt 187.1 lb

## 2022-12-05 DIAGNOSIS — G8929 Other chronic pain: Secondary | ICD-10-CM

## 2022-12-05 DIAGNOSIS — M25512 Pain in left shoulder: Secondary | ICD-10-CM | POA: Diagnosis not present

## 2022-12-05 DIAGNOSIS — I10 Essential (primary) hypertension: Secondary | ICD-10-CM | POA: Diagnosis not present

## 2022-12-05 MED ORDER — AMLODIPINE-OLMESARTAN 5-20 MG PO TABS: 1.0000 | ORAL_TABLET | Freq: Every day | ORAL | 1 refills | Status: DC

## 2022-12-05 MED ORDER — MELOXICAM 7.5 MG PO TABS: 7.5000 mg | ORAL_TABLET | Freq: Two times a day (BID) | ORAL | 0 refills | Status: AC | PRN

## 2022-12-05 NOTE — Progress Notes (Signed)
Established patient visit   Patient: Howard Faulkner   DOB: [birth year]   62 y.o. Male  MRN: [id number] Visit Date: 12/05/2022  Chief Complaint  Patient presents with   Shoulder Pain   Subjective    Shoulder Pain  The pain is present in the left shoulder. This is a chronic problem. The current episode started more than 1 month ago. There has been no history of extremity trauma. The problem occurs daily (mostly at night). The problem has been gradually worsening. The quality of the pain is described as aching. The pain is at a severity of 8/10. The symptoms are aggravated by lying down. He has tried nothing for the symptoms.    Medications: Outpatient Medications Prior to Visit  Medication Sig   baclofen (LIORESAL) 10 MG tablet Take 10 mg by mouth 2 (two) times daily as needed.   finasteride (PROSCAR) 5 MG tablet Take 5 mg by mouth daily.   latanoprost (XALATAN) 0.005 % ophthalmic solution    [DISCONTINUED] amLODipine-olmesartan (AZOR) 5-20 MG tablet Take 1 tablet by mouth daily. For hypertension   [DISCONTINUED] etodolac (LODINE) 500 MG tablet Take 1 tablet po BID as needed for joint pain   docusate sodium (COLACE) 100 MG capsule Take 1 capsule (100 mg total) by mouth every 12 (twelve) hours. (Patient not taking: Reported on 08/16/2021)   dorzolamide-timolol (COSOPT) 22.3-6.8 MG/ML ophthalmic solution 1 drop 2 (two) times daily. (Patient not taking: Reported on 08/16/2021)   gabapentin (NEURONTIN) 300 MG capsule Take 300 mg by mouth 2 (two) times daily. (Patient not taking: Reported on 07/20/2021)   tamsulosin (FLOMAX) 0.4 MG CAPS capsule Take 0.4 mg by mouth daily. (Patient not taking: Reported on 08/16/2021)   zonisamide (ZONEGRAN) 25 MG capsule Take 100 mg by mouth daily. (Patient not taking: Reported on 08/16/2021)   No facility-administered medications prior to visit.    Review of Systems  Musculoskeletal:  Positive for arthralgias and myalgias.       Left shoulder pain       Objective     Today's Vitals   12/05/22 1317  BP: (Abnormal) 153/91  Pulse: 75  SpO2: 98%  Weight: 187 lb 1.9 oz (84.9 kg)  Height: 5\' 7"  (1.702 m)   Body mass index is 29.31 kg/m.   Physical Exam Vitals and nursing note reviewed.  Constitutional:      Appearance: Normal appearance.  HENT:     Head: Normocephalic and atraumatic.     Nose: Nose normal.  Pulmonary:     Effort: No respiratory distress.  Musculoskeletal:     Right shoulder: Normal.     Left shoulder: Bony tenderness present. No swelling, deformity, effusion, laceration, tenderness or crepitus. Normal range of motion. Normal strength. Normal pulse.  Neurological:     Mental Status: He is alert.      Assessment & Plan     Chronic left shoulder pain Assessment & Plan: -take meloxicam 7.5 mg twice daily as needed for pain/inflammation -x-ray left shoulder and refer to orthopedics as indicated   Orders: -     DG Shoulder Left -     Meloxicam; Take 1 tablet (7.5 mg total) by mouth 2 (two) times daily as needed for pain.  Dispense: 90 tablet; Refill: 0  Essential hypertension Assessment & Plan: Take amlodipine/plmesartan twice daily.  -limit salt and increase water intake daily -recheck in 2 months.   Orders: -     amLODIPine-Olmesartan; Take 1 tablet by mouth daily. For hypertension  Dispense: 90  tablet; Refill: 1     Return in about 2 months (around 02/04/2023) for blood pressure, FBW a week prior to visit. he will need MWV after travel .        Carlean Jews, NP  [facility] [phone] (phone) [phone] (fax)  [facility]

## 2023-01-03 ENCOUNTER — Other Ambulatory Visit: Payer: Self-pay

## 2023-01-03 DIAGNOSIS — R7301 Impaired fasting glucose: Secondary | ICD-10-CM

## 2023-01-03 DIAGNOSIS — Z130 Encounter for screening for diseases of the blood and blood-forming organs and certain disorders involving the immune mechanism: Secondary | ICD-10-CM

## 2023-01-03 DIAGNOSIS — Z0000 Encounter for general adult medical examination without abnormal findings: Secondary | ICD-10-CM

## 2023-01-04 DIAGNOSIS — G8929 Other chronic pain: Secondary | ICD-10-CM | POA: Insufficient documentation

## 2023-01-04 NOTE — Assessment & Plan Note (Signed)
-  take meloxicam 7.5 mg twice daily as needed for pain/inflammation -x-ray left shoulder and refer to orthopedics as indicated

## 2023-01-04 NOTE — Assessment & Plan Note (Signed)
Take amlodipine/plmesartan twice daily.  -limit salt and increase water intake daily -recheck in 2 months.

## 2023-01-16 ENCOUNTER — Other Ambulatory Visit: Payer: Medicare Other

## 2023-01-16 DIAGNOSIS — R7301 Impaired fasting glucose: Secondary | ICD-10-CM

## 2023-01-16 DIAGNOSIS — Z0000 Encounter for general adult medical examination without abnormal findings: Secondary | ICD-10-CM

## 2023-01-16 DIAGNOSIS — Z130 Encounter for screening for diseases of the blood and blood-forming organs and certain disorders involving the immune mechanism: Secondary | ICD-10-CM

## 2023-01-17 LAB — COMPREHENSIVE METABOLIC PANEL
ALT: 29 IU/L (ref 0–44)
AST: 16 IU/L (ref 0–40)
Albumin/Globulin Ratio: 2.2 (ref 1.2–2.2)
Albumin: 4.7 g/dL (ref 3.9–4.9)
Alkaline Phosphatase: 87 IU/L (ref 44–121)
BUN/Creatinine Ratio: 6 — ABNORMAL LOW (ref 10–24)
BUN: 7 mg/dL — ABNORMAL LOW (ref 8–27)
Bilirubin Total: 0.6 mg/dL (ref 0.0–1.2)
CO2: 23 mmol/L (ref 20–29)
Calcium: 9.8 mg/dL (ref 8.6–10.2)
Chloride: 103 mmol/L (ref 96–106)
Creatinine, Ser: 1.12 mg/dL (ref 0.76–1.27)
Globulin, Total: 2.1 g/dL (ref 1.5–4.5)
Glucose: 92 mg/dL (ref 70–99)
Potassium: 4.2 mmol/L (ref 3.5–5.2)
Sodium: 140 mmol/L (ref 134–144)
Total Protein: 6.8 g/dL (ref 6.0–8.5)
eGFR: 74 mL/min/{1.73_m2} (ref >59)

## 2023-01-17 LAB — LIPID PANEL
Chol/HDL Ratio: 5.6 ratio — ABNORMAL HIGH (ref 0.0–5.0)
Cholesterol, Total: 206 mg/dL — ABNORMAL HIGH (ref 100–199)
HDL: 37 mg/dL — ABNORMAL LOW (ref >39)
LDL Chol Calc (NIH): 134 mg/dL — ABNORMAL HIGH (ref 0–99)
Triglycerides: 195 mg/dL — ABNORMAL HIGH (ref 0–149)
VLDL Cholesterol Cal: 35 mg/dL (ref 5–40)

## 2023-01-17 LAB — TSH: TSH: 3.09 u[IU]/mL (ref 0.450–4.500)

## 2023-01-17 LAB — CBC
Hematocrit: 50.6 % (ref 37.5–51.0)
Hemoglobin: 16.7 g/dL (ref 13.0–17.7)
MCH: 28 pg (ref 26.6–33.0)
MCHC: 33 g/dL (ref 31.5–35.7)
MCV: 85 fL (ref 79–97)
Platelets: 180 10*3/uL (ref 150–450)
RBC: 5.97 x10E6/uL — ABNORMAL HIGH (ref 4.14–5.80)
RDW: 12.6 % (ref 11.6–15.4)
WBC: 5.2 10*3/uL (ref 3.4–10.8)

## 2023-01-17 LAB — HEMOGLOBIN A1C
Est. average glucose Bld gHb Est-mCnc: 114 mg/dL
Hgb A1c MFr Bld: 5.6 % (ref 4.8–5.6)

## 2023-01-17 NOTE — Progress Notes (Signed)
Moderately elevated lipid panel. Discuss with patient at visit 01/25/2023

## 2023-01-25 ENCOUNTER — Ambulatory Visit (INDEPENDENT_AMBULATORY_CARE_PROVIDER_SITE_OTHER): Payer: Medicare Other | Admitting: Nurse Practitioner

## 2023-01-25 ENCOUNTER — Encounter: Payer: Self-pay | Admitting: Nurse Practitioner

## 2023-01-25 VITALS — BP 156/95 | HR 71 | Ht 67.0 in | Wt 187.0 lb

## 2023-01-25 DIAGNOSIS — I10 Essential (primary) hypertension: Secondary | ICD-10-CM

## 2023-01-25 DIAGNOSIS — Z6829 Body mass index (BMI) 29.0-29.9, adult: Secondary | ICD-10-CM

## 2023-01-25 DIAGNOSIS — E782 Mixed hyperlipidemia: Secondary | ICD-10-CM | POA: Diagnosis not present

## 2023-01-25 MED ORDER — AMLODIPINE BESYLATE 5 MG PO TABS: 5.0000 mg | ORAL_TABLET | Freq: Every day | ORAL | 1 refills | Status: AC

## 2023-01-25 MED ORDER — ROSUVASTATIN CALCIUM 5 MG PO TABS
5.0000 mg | ORAL_TABLET | Freq: Every day | ORAL | 1 refills | Status: AC
Start: 2023-01-25 — End: ?

## 2023-01-25 MED ORDER — OLMESARTAN MEDOXOMIL 20 MG PO TABS: 20.0000 mg | ORAL_TABLET | Freq: Every day | ORAL | 1 refills | Status: AC

## 2023-01-25 NOTE — Progress Notes (Signed)
Established patient visit   Patient: Howard Faulkner   DOB: [birth year]   62 y.o. Male  MRN: [id number] Visit Date: 01/25/2023   Chief Complaint  Patient presents with   Medical Management of Chronic Issues   Subjective    HPI  Follow up -HTN --blood pressure high today. Not taking medication. He states due to cost. Medication over $300  -HLD -fasting blood work for today's visit  --moderate elevation lipid panel --other labs essentially normal  -He denies chest pain, chest pressure, or shortness of breath. He denies headaches or visual disturbances. He denies abdominal pain, nausea, vomiting, or changes in bowel or bladder habits.       Medications: Outpatient Medications Prior to Visit  Medication Sig   baclofen (LIORESAL) 10 MG tablet Take 10 mg by mouth 2 (two) times daily as needed.   docusate sodium (COLACE) 100 MG capsule Take 1 capsule (100 mg total) by mouth every 12 (twelve) hours. (Patient not taking: Reported on 08/16/2021)   dorzolamide-timolol (COSOPT) 22.3-6.8 MG/ML ophthalmic solution 1 drop 2 (two) times daily. (Patient not taking: Reported on 08/16/2021)   finasteride (PROSCAR) 5 MG tablet Take 5 mg by mouth daily.   gabapentin (NEURONTIN) 300 MG capsule Take 300 mg by mouth 2 (two) times daily. (Patient not taking: Reported on 07/20/2021)   latanoprost (XALATAN) 0.005 % ophthalmic solution    meloxicam (MOBIC) 7.5 MG tablet Take 1 tablet (7.5 mg total) by mouth 2 (two) times daily as needed for pain.   tamsulosin (FLOMAX) 0.4 MG CAPS capsule Take 0.4 mg by mouth daily. (Patient not taking: Reported on 08/16/2021)   zonisamide (ZONEGRAN) 25 MG capsule Take 100 mg by mouth daily. (Patient not taking: Reported on 08/16/2021)   [DISCONTINUED] amLODipine-olmesartan (AZOR) 5-20 MG tablet Take 1 tablet by mouth daily. For hypertension (Patient not taking: Reported on 01/25/2023)   No facility-administered medications prior to visit.   Past Medical History:   Diagnosis Date   Hypertension      Review of Systems See HPI    Last CBC Lab Results  Component Value Date   WBC 5.2 01/16/2023   HGB 16.7 01/16/2023   HCT 50.6 01/16/2023   MCV 85 01/16/2023   MCH 28.0 01/16/2023   RDW 12.6 01/16/2023   PLT 180 01/16/2023   Last metabolic panel Lab Results  Component Value Date   GLUCOSE 92 01/16/2023   NA 140 01/16/2023   K 4.2 01/16/2023   CL 103 01/16/2023   CO2 23 01/16/2023   BUN 7 (L) 01/16/2023   CREATININE 1.12 01/16/2023   EGFR 74 01/16/2023   CALCIUM 9.8 01/16/2023   PROT 6.8 01/16/2023   ALBUMIN 4.7 01/16/2023   LABGLOB 2.1 01/16/2023   AGRATIO 2.2 01/16/2023   BILITOT 0.6 01/16/2023   ALKPHOS 87 01/16/2023   AST 16 01/16/2023   ALT 29 01/16/2023   ANIONGAP 6 03/17/2021   Last lipids Lab Results  Component Value Date   CHOL 206 (H) 01/16/2023   HDL 37 (L) 01/16/2023   LDLCALC 134 (H) 01/16/2023   TRIG 195 (H) 01/16/2023   CHOLHDL 5.6 (H) 01/16/2023   Last hemoglobin A1c Lab Results  Component Value Date   HGBA1C 5.6 01/16/2023   Last thyroid functions Lab Results  Component Value Date   TSH 3.090 01/16/2023       Objective     Today's Vitals   01/25/23 0905 01/25/23 0933  BP: (Abnormal) 153/98 (Abnormal) 156/95  Pulse: 71   SpO2: 97%  Weight: 187 lb (84.8 kg)   Height: 5\' 7"  (1.702 m)    Body mass index is 29.29 kg/m.  BP Readings from Last 3 Encounters:  01/25/23 (Abnormal) 156/95  12/05/22 (Abnormal) 153/91  09/06/21 (Abnormal) 146/81    Wt Readings from Last 3 Encounters:  01/25/23 187 lb (84.8 kg)  12/05/22 187 lb 1.9 oz (84.9 kg)  09/06/21 183 lb 12.8 oz (83.4 kg)    Physical Exam Vitals and nursing note reviewed.  Constitutional:      Appearance: Normal appearance. He is well-developed.  HENT:     Head: Normocephalic and atraumatic.     Nose: Nose normal.     Mouth/Throat:     Mouth: Mucous membranes are moist.     Pharynx: Oropharynx is clear.  Eyes:     Extraocular  Movements: Extraocular movements intact.     Conjunctiva/sclera: Conjunctivae normal.     Pupils: Pupils are equal, round, and reactive to light.  Neck:     Vascular: No carotid bruit.  Cardiovascular:     Rate and Rhythm: Normal rate and regular rhythm.     Pulses: Normal pulses.     Heart sounds: Normal heart sounds.  Pulmonary:     Breath sounds: Normal breath sounds.  Abdominal:     Palpations: Abdomen is soft.  Musculoskeletal:        General: Normal range of motion.     Cervical back: Normal range of motion and neck supple.  Lymphadenopathy:     Cervical: No cervical adenopathy.  Skin:    General: Skin is warm and dry.     Capillary Refill: Capillary refill takes less than 2 seconds.  Neurological:     General: No focal deficit present.     Mental Status: He is alert and oriented to person, place, and time.  Psychiatric:        Mood and Affect: Mood normal.        Behavior: Behavior normal.        Thought Content: Thought content normal.        Judgment: Judgment normal.      Assessment & Plan    Essential hypertension Assessment & Plan: Add amlodipine 5 mg daily Continue losartan as previously prescribed Reviewed importance of DASH diet, Check blood pressure daily.  He understands blood pressure should be 130/80 or better.    Orders: -     amLODIPine Besylate; Take 1 tablet (5 mg total) by mouth daily.  Dispense: 90 tablet; Refill: 1 -     Olmesartan Medoxomil; Take 1 tablet (20 mg total) by mouth daily.  Dispense: 90 tablet; Refill: 1  Mixed hyperlipidemia Assessment & Plan: Stable.  Continue current medication.   Orders: -     Rosuvastatin Calcium; Take 1 tablet (5 mg total) by mouth daily.  Dispense: 90 tablet; Refill: 1  BMI 29.0-29.9,adult Assessment & Plan: Discussed lowering calorie intake to 1500 calories per day and incorporating exercise into daily routine to help lose weight.       Return in about 4 months (around 05/28/2023) for blood  pressure, fasting lipids and CMp a week prior to visit .         Carlean Jews, NP  [facility] at [facility] [phone] (phone) [phone] (fax)  [facility]

## 2023-02-04 NOTE — Assessment & Plan Note (Signed)
Stable.  Continue current medication

## 2023-02-04 NOTE — Assessment & Plan Note (Signed)
Add amlodipine 5 mg daily Continue losartan as previously prescribed Reviewed importance of DASH diet, Check blood pressure daily.  He understands blood pressure should be 130/80 or better.

## 2023-02-04 NOTE — Assessment & Plan Note (Signed)
Discussed lowering calorie intake to 1500 calories per day and incorporating exercise into daily routine to help lose weight.  °

## 2023-05-28 ENCOUNTER — Ambulatory Visit: Payer: Medicare Other | Admitting: Family Medicine

## 2023-05-28 ENCOUNTER — Encounter: Payer: Self-pay | Admitting: Family Medicine

## 2023-05-28 NOTE — Progress Notes (Deleted)
Established Patient Office Visit  Subjective   Patient ID: Howard Faulkner, male    DOB: [birth year]  Age: 62 y.o. MRN: [id number]  No chief complaint on file.   HPI  Hypertension losartan amlodipine?  HLD-what dose of Crestor is he taking?  Why was it not increased back in May?   The 10-year ASCVD risk score (Arnett DK, et al., 2019) is: 23.9%  Health Maintenance Due  Topic Date Due   Medicare Annual Wellness (AWV)  Never done   COVID-19 Vaccine (1) Never done   HIV Screening  Never done   Hepatitis C Screening  Never done   DTaP/Tdap/Td (1 - Tdap) Never done   Zoster Vaccines- Shingrix (1 of 2) Never done   Colonoscopy  Never done   INFLUENZA VACCINE  Never done      Objective:     There were no vitals taken for this visit. {Vitals History (Optional):23777}  Physical Exam   No results found for any visits on 05/28/23.      Assessment & Plan:   There are no diagnoses linked to this encounter.   No follow-ups on file.    Sandre Kitty, MD
